# Patient Record
Sex: Male | Born: 1935 | Race: White | Hispanic: No | Smoking: Former smoker
Health system: Southern US, Community
[De-identification: ages and names within clinical notes are randomized; demographics above are authoritative.]

## PROBLEM LIST (undated history)

## (undated) DIAGNOSIS — F0390 Unspecified dementia without behavioral disturbance: Secondary | ICD-10-CM

## (undated) DIAGNOSIS — R42 Dizziness and giddiness: Secondary | ICD-10-CM

## (undated) DIAGNOSIS — I639 Cerebral infarction, unspecified: Secondary | ICD-10-CM

## (undated) DIAGNOSIS — I6529 Occlusion and stenosis of unspecified carotid artery: Secondary | ICD-10-CM

## (undated) DIAGNOSIS — I10 Essential (primary) hypertension: Secondary | ICD-10-CM

## (undated) DIAGNOSIS — K219 Gastro-esophageal reflux disease without esophagitis: Secondary | ICD-10-CM

## (undated) DIAGNOSIS — M1990 Unspecified osteoarthritis, unspecified site: Secondary | ICD-10-CM

## (undated) HISTORY — DX: Occlusion and stenosis of unspecified carotid artery: I65.29

## (undated) HISTORY — PX: CATARACT EXTRACTION: SUR2

---

## 2006-06-01 ENCOUNTER — Ambulatory Visit (HOSPITAL_COMMUNITY): Admission: RE | Admit: 2006-06-01 | Discharge: 2006-06-01 | Payer: Self-pay | Admitting: Ophthalmology

## 2006-09-21 ENCOUNTER — Ambulatory Visit (HOSPITAL_COMMUNITY): Admission: RE | Admit: 2006-09-21 | Discharge: 2006-09-21 | Payer: Self-pay | Admitting: Ophthalmology

## 2008-09-26 ENCOUNTER — Ambulatory Visit (HOSPITAL_COMMUNITY): Admission: RE | Admit: 2008-09-26 | Discharge: 2008-09-26 | Payer: Self-pay | Admitting: Pulmonary Disease

## 2011-10-05 ENCOUNTER — Emergency Department (HOSPITAL_COMMUNITY): Payer: Medicare Other

## 2011-10-05 ENCOUNTER — Inpatient Hospital Stay (HOSPITAL_COMMUNITY)
Admission: EM | Admit: 2011-10-05 | Discharge: 2011-10-07 | DRG: 066 | Disposition: A | Discharge status: Home / Self Care | Payer: Medicare Other | Attending: Pulmonary Disease | Admitting: Pulmonary Disease

## 2011-10-05 ENCOUNTER — Other Ambulatory Visit: Payer: Self-pay

## 2011-10-05 ENCOUNTER — Encounter: Payer: Self-pay | Admitting: Emergency Medicine

## 2011-10-05 DIAGNOSIS — I63239 Cerebral infarction due to unspecified occlusion or stenosis of unspecified carotid arteries: Principal | ICD-10-CM | POA: Diagnosis present

## 2011-10-05 DIAGNOSIS — R29898 Other symptoms and signs involving the musculoskeletal system: Secondary | ICD-10-CM | POA: Diagnosis present

## 2011-10-05 DIAGNOSIS — G459 Transient cerebral ischemic attack, unspecified: Secondary | ICD-10-CM | POA: Diagnosis present

## 2011-10-05 DIAGNOSIS — I639 Cerebral infarction, unspecified: Secondary | ICD-10-CM

## 2011-10-05 HISTORY — DX: Unspecified osteoarthritis, unspecified site: M19.90

## 2011-10-05 HISTORY — DX: Dizziness and giddiness: R42

## 2011-10-05 LAB — BASIC METABOLIC PANEL
CO2: 30 mEq/L (ref 19–32)
Chloride: 97 mEq/L (ref 96–112)
Creatinine, Ser: 1.19 mg/dL (ref 0.50–1.35)
GFR calc non Af Amer: 55 mL/min — ABNORMAL LOW (ref >90)
Potassium: 3.7 mEq/L (ref 3.5–5.1)
Sodium: 137 mEq/L (ref 135–145)

## 2011-10-05 LAB — CBC: RDW: 13.2 % (ref 11.5–15.5)

## 2011-10-05 LAB — CARDIAC PANEL(CRET KIN+CKTOT+MB+TROPI)
CK, MB: 2.9 ng/mL (ref 0.3–4.0)
Relative Index: INVALID (ref 0.0–2.5)
Troponin I: <0.3 ng/mL (ref <0.30)

## 2011-10-05 LAB — PROTIME-INR: INR: 1.07 (ref 0.00–1.49)

## 2011-10-05 MED ORDER — ENOXAPARIN SODIUM 40 MG/0.4ML ~~LOC~~ SOLN
40.0000 mg | SUBCUTANEOUS | Status: DC
  Administered 2011-10-05 – 2011-10-06 (×2): 40 mg via SUBCUTANEOUS
  Filled 2011-10-05 (×2): qty 0.4

## 2011-10-05 MED ORDER — ONDANSETRON HCL 4 MG/2ML IJ SOLN: 4.0000 mg | Freq: Four times a day (QID) | INTRAMUSCULAR | Status: DC | PRN

## 2011-10-05 MED ORDER — SODIUM CHLORIDE 0.9 % IJ SOLN: 3.0000 mL | INTRAMUSCULAR | Status: DC | PRN

## 2011-10-05 MED ORDER — CLOPIDOGREL BISULFATE 75 MG PO TABS
75.0000 mg | ORAL_TABLET | Freq: Every day | ORAL | Status: DC
  Administered 2011-10-06 – 2011-10-07 (×2): 75 mg via ORAL
  Filled 2011-10-05 (×2): qty 1

## 2011-10-05 MED ORDER — SODIUM CHLORIDE 0.9 % IV SOLN: 250.0000 mL | INTRAVENOUS | Status: DC

## 2011-10-05 MED ORDER — ALUM & MAG HYDROXIDE-SIMETH 200-200-20 MG/5ML PO SUSP: 30.0000 mL | Freq: Four times a day (QID) | ORAL | Status: DC | PRN

## 2011-10-05 MED ORDER — ROSUVASTATIN CALCIUM 20 MG PO TABS
40.0000 mg | ORAL_TABLET | Freq: Every day | ORAL | Status: DC
  Administered 2011-10-05 – 2011-10-06 (×2): 40 mg via ORAL
  Filled 2011-10-05: qty 1
  Filled 2011-10-05: qty 2

## 2011-10-05 MED ORDER — RAMIPRIL 2.5 MG PO CAPS
2.5000 mg | ORAL_CAPSULE | Freq: Every day | ORAL | Status: DC
  Administered 2011-10-05 – 2011-10-07 (×3): 2.5 mg via ORAL
  Filled 2011-10-05 (×3): qty 1

## 2011-10-05 MED ORDER — ACETAMINOPHEN 650 MG RE SUPP: 650.0000 mg | Freq: Four times a day (QID) | RECTAL | Status: DC | PRN

## 2011-10-05 MED ORDER — ONDANSETRON HCL 4 MG PO TABS: 4.0000 mg | ORAL_TABLET | Freq: Four times a day (QID) | ORAL | Status: DC | PRN

## 2011-10-05 MED ORDER — SENNA 8.6 MG PO TABS: 2.0000 | ORAL_TABLET | Freq: Every day | ORAL | Status: DC | PRN

## 2011-10-05 MED ORDER — SODIUM CHLORIDE 0.9 % IJ SOLN
3.0000 mL | Freq: Two times a day (BID) | INTRAMUSCULAR | Status: DC
  Administered 2011-10-05 – 2011-10-06 (×3): 3 mL via INTRAVENOUS
  Filled 2011-10-05: qty 3

## 2011-10-05 MED ORDER — ACETAMINOPHEN 325 MG PO TABS
650.0000 mg | ORAL_TABLET | Freq: Four times a day (QID) | ORAL | Status: DC | PRN
  Filled 2011-10-05: qty 2

## 2011-10-05 MED ORDER — SODIUM CHLORIDE 0.9 % IV SOLN
INTRAVENOUS | Status: DC
  Administered 2011-10-05: 12:00:00 via INTRAVENOUS

## 2011-10-05 MED ORDER — TRAZODONE HCL 50 MG PO TABS: 25.0000 mg | ORAL_TABLET | Freq: Every evening | ORAL | Status: DC | PRN

## 2011-10-05 NOTE — ED Notes (Signed)
Patient with c/o right sided weakness. Patient reports he went into church this morning around 0715am and states, "everything got heavy, it was difficult to do normal things, I forgot where things were, my right side felt heavy, and I had difficulty getting my words out." Son reports his father's speech is slightly slurred and that the right side of his face is slightly drooping. Patient alert/oriented and able to follow commands. No arm drift noted.

## 2011-10-05 NOTE — ED Provider Notes (Addendum)
History    Scribed for Shelda Jakes, MD, the patient was seen in room [facility]. This chart was scribed by Katha Cabal.   CSN: [id number] Arrival date & time: 10/05/2011 10:52 AM   First MD Initiated Contact with Patient 10/05/11 1048      Chief Complaint  Patient presents with  . Extremity Weakness    (Consider location/radiation/quality/duration/timing/severity/associated sxs/prior treatment) Patient is a 77 y.o. male presenting with extremity weakness. The history is provided by the patient and a relative. No language interpreter was used.  Extremity Weakness This is a new problem. The current episode started 3 to 5 hours ago. The problem occurs constantly. The problem has been gradually improving. Pertinent negatives include no headaches and no shortness of breath. He has tried nothing for the symptoms.  Patient drove to church at 7 AM.  Patient reports sudden onset of right leg heaviness.  Patient adds that he was not able to lift coffee pot at church.  Patient states "I felt like I was sloppy and heavy."  Patient did not notice any weakness before heading to church this AM.    Patient was able to drive home from church.  Patient states "it was hard to take clothes off" after returning home.  Family reports that patient was unsure how to change channel on TV and having trouble talking this AM.  Patient reports intermittent dizziness while bending over several months ago.    PCP Dr. Juanetta Gosling    Past Medical History  Diagnosis Date  . Arthritis   . Dizziness     Past Surgical History  Procedure Date  . Cataract extraction     No family history on file.  History  Substance Use Topics  . Smoking status: Former Games developer  . Smokeless tobacco: Not on file  . Alcohol Use: No      Review of Systems  Constitutional: Negative for fever.  Respiratory: Negative for shortness of breath.   Gastrointestinal: Negative for nausea, vomiting and diarrhea.  Genitourinary:  Negative for dysuria.  Musculoskeletal: Positive for extremity weakness. Negative for back pain.  Skin: Negative for rash.  Neurological: Negative for headaches.  Psychiatric/Behavioral: Positive for confusion.  All other systems reviewed and are negative.    Allergies  Review of patient's allergies indicates no known allergies.  Home Medications  No current outpatient prescriptions on file.  BP 192/87  Pulse 89  Temp(Src) 97.8 F (36.6 C) (Oral)  Resp 19  Ht 5\' 11"  (1.803 m)  Wt 165 lb (74.844 kg)  BMI 23.01 kg/m2  SpO2 97%  Physical Exam  Constitutional: He is oriented to person, place, and time. He appears well-developed and well-nourished. No distress.  HENT:  Head: Normocephalic and atraumatic.  Eyes: Conjunctivae and EOM are normal.  Neck: Neck supple.  Cardiovascular: Normal rate and regular rhythm.   Pulmonary/Chest: Effort normal and breath sounds normal. No respiratory distress.  Abdominal: Soft. Bowel sounds are normal. There is no tenderness. There is no rebound and no guarding.  Musculoskeletal: Normal range of motion. He exhibits no edema.  Neurological: He is alert and oriented to person, place, and time. No cranial nerve deficit. Coordination and gait normal.       equal strength in bilateral LE,  Subtle right hand weakness as compared to left,  per family subtle right side facial droop,   Skin: Skin is warm and dry.  Psychiatric: He has a normal mood and affect. His speech is normal and behavior is normal.  ED Course  Procedures (including critical care time)   DIAGNOSTIC STUDIES: Oxygen Saturation is 100% on room air, normal by my interpretation.     EKG:  Date: 10/05/2011  Rate: 87  Rhythm: normal sinus rhythm and sinus arrhythmia  QRS Axis: normal  Intervals: normal  ST/T Wave abnormalities: early repolarization  Conduction Disutrbances:none  Narrative Interpretation:   Old EKG Reviewed: none available    COORDINATION OF  CARE:  11:34 AM  Physical exam complete.  Advised patient and family of stroke protocol.  Will likely admit patient for observation.   12:07 PM  Head CT was negative.  12:47 PM Consult call returned.  Consulted with Dr. Juanetta Gosling, Internal medicine regarding patient's case.  Dr. Juanetta Gosling will evaluate patient in ED.  12:51 PM  Advised patient and family that Dr. Juanetta Gosling will be in to evaluate patient.      Orders Placed This Encounter  Procedures  . CT Head Wo Contrast  . CBC  . Basic metabolic panel  . Protime-INR  . APTT  . Cardiac panel(cret kin+cktot+mb+tropi)  . Diet NPO time specified  . Vital signs every 2 hours x 12 hours then every 4 hours  . Neuro checks every 2 hours x 12 hours then every 4 hours  . DO NOT attempt to lower BP  . CBG (Glucose < 50 or >400 contraindication for tPA)  . If O2 Sat <92%, administer O2 at 2 liters/minute via nasal cannula.  Gavin Potters attending physician after Code Stroke put in process.  . DO NOT INFUSE DEXTROSE SOLUTIONS  . Consult to internal medicine     LABS / RADIOLOGY:   Labs Reviewed  BASIC METABOLIC PANEL - Abnormal; Notable for the following:    Glucose, Bld 108 (*)    BUN 24 (*)    Calcium 11.2 (*)    GFR calc non Af Amer 55 (*)    GFR calc Af Amer 63 (*)    All other components within normal limits  CBC  PROTIME-INR  APTT  CARDIAC PANEL(CRET KIN+CKTOT+MB+TROPI)   Results for orders placed during the hospital encounter of 10/05/11  CBC      Component Value Range   WBC 6.9  4.0 - 10.5 (K/uL)   RBC 4.98  4.22 - 5.81 (MIL/uL)   Hemoglobin 14.4  13.0 - 17.0 (g/dL)   HCT 40.9  81.1 - 91.4 (%)   MCV 89.2  78.0 - 100.0 (fL)   MCH 28.9  26.0 - 34.0 (pg)   MCHC 32.4  30.0 - 36.0 (g/dL)   RDW 78.2  95.6 - 21.3 (%)   Platelets 269  150 - 400 (K/uL)  BASIC METABOLIC PANEL      Component Value Range   Sodium 137  135 - 145 (mEq/L)   Potassium 3.7  3.5 - 5.1 (mEq/L)   Chloride 97  96 - 112 (mEq/L)   CO2 30  19 - 32 (mEq/L)    Glucose, Bld 108 (*) 70 - 99 (mg/dL)   BUN 24 (*) 6 - 23 (mg/dL)   Creatinine, Ser 0.86  0.50 - 1.35 (mg/dL)   Calcium 57.8 (*) 8.4 - 10.5 (mg/dL)   GFR calc non Af Amer 55 (*) >90 (mL/min)   GFR calc Af Amer 63 (*) >90 (mL/min)  PROTIME-INR      Component Value Range   Prothrombin Time 14.1  11.6 - 15.2 (seconds)   INR 1.07  0.00 - 1.49   APTT      Component  Value Range   aPTT 31  24 - 37 (seconds)  CARDIAC PANEL(CRET KIN+CKTOT+MB+TROPI)      Component Value Range   Total CK 44  7 - 232 (U/L)   CK, MB 2.9  0.3 - 4.0 (ng/mL)   Troponin I <0.30  <0.30 (ng/mL)   Relative Index RELATIVE INDEX IS INVALID  0.0 - 2.5     Ct Head Wo Contrast  10/05/2011  *RADIOLOGY REPORT*  Clinical Data: Right leg/arm weakness, evaluate for stroke  CT HEAD WITHOUT CONTRAST  Technique:  Contiguous axial images were obtained from the base of the skull through the vertex without contrast.  Comparison: 09/27/2011  Findings: No evidence of parenchymal hemorrhage or extra-axial fluid collection. No mass lesion, mass effect, or midline shift.  No CT evidence of acute infarction.  Extensive subcortical white matter and periventricular small vessel ischemic changes, including the subcortical left frontal lobe, unchanged.  Intracranial atherosclerosis.  Global cortical atrophy.  The visualized paranasal sinuses are essentially clear. The mastoid air cells are unopacified.  No evidence of calvarial fracture.  IMPRESSION: No evidence of acute intracranial abnormality.  Atrophy with extensive small vessel ischemic changes and intracranial atherosclerosis.  Original Report Authenticated By: Charline Bills, M.D.         MDM   MDM: Patient clinically has had a mild stroke may still be a TIA enough time is not progressed to make determination. Head CT negative. Symptoms seem to be improving in the emergency partner still has some mild mild right hand weakness. Discussed with Dr. Juanetta Gosling who is his primary care provider  he will come in and admit the patient. MRI is not available today. Patient presented outside the TPA window however his symptoms were so mild upon presentation TPA probably would not been recommended.   CRITICAL CARE Performed by: Shelda Jakes.   Total critical care time: 30  Critical care time was exclusive of separately billable procedures and treating other patients.  Critical care was necessary to treat or prevent imminent or life-threatening deterioration.  Critical care was time spent personally by me on the following activities: development of treatment plan with patient and/or surrogate as well as nursing, discussions with consultants, evaluation of patient's response to treatment, examination of patient, obtaining history from patient or surrogate, ordering and performing treatments and interventions, ordering and review of laboratory studies, ordering and review of radiographic studies, pulse oximetry and re-evaluation of patient's condition.  Patient blood pressure slightly elevated today the systolic is only in the 190s diastolic was normal no reason to intervene on lowering the blood pressure at this level. In fact it could be determined to do so. Patient not a candidate for TPA as stated above. Patient was monitored carefully pending lab results.   MEDICATIONS GIVEN IN THE E.D. Scheduled Meds:  Continuous Infusions:    . sodium chloride 100 mL/hr at 10/05/11 1151       IMPRESSION: Stroke    I personally performed the services described in this documentation, which was scribed in my presence. The recorded information has been reviewed and considered.             Shelda Jakes, MD 10/05/11 1300  Shelda Jakes, MD 10/06/11 [phone]

## 2011-10-05 NOTE — H&P (Signed)
Clinton Chambers MRN: [id number] DOB/AGE: 1926-06-17 77 y.o. Primary Care Physician:No primary provider on file. Admit date: 10/05/2011 Chief Complaint: TIA versus stroke HPI: This is an 77 year old who was in his usual state of good health at home. He said he got up and had sudden onset of right leg and right arm heaviness and weakness. He was clumsy with his right hand he was not able to lift a coffee pot. He became somewhat confused. He eventually got home and his family came to check on him and thought that he needed to come to the emergency room. In the emergency room he has had almost complete recovery but still has some right-sided weakness. There is some question as to whether he had some problem with speech and he did seem a little confused.  Past Medical History  Diagnosis Date  . Arthritis   . Dizziness    Past Surgical History  Procedure Date  . Cataract extraction         No family history on file.  Social History:  reports that he has quit smoking. He does not have any smokeless tobacco history on file. He reports that he does not drink alcohol or use illicit drugs. He lives at home alone  Allergies:  Allergies  Allergen Reactions  . Vicodin (Hydrocodone-Acetaminophen) Other (See Comments)    'too strong' causes me to 'fall out'    Medications Prior to Admission  Medication Dose Route Frequency Provider Last Rate Last Dose  . 0.9 %  sodium chloride infusion   Intravenous Continuous Shelda Jakes, MD 100 mL/hr at 10/05/11 1151     No current outpatient prescriptions on file as of 10/05/2011.       WUJ:WJXBJ from the symptoms mentioned above,there are no other symptoms referable to all systems reviewed.  Physical Exam: Blood pressure 192/87, pulse 89, temperature 97.8 F (36.6 C), temperature source Oral, resp. rate 19, height 5\' 11"  (1.803 m), weight 74.844 kg (165 lb), SpO2 97.00%. He is awake and alert. He has some questionable asymmetry of his face.  His tongue protrudes midline. His pupils are reactive to light and accommodation. His nose and throat are clear. His neck is supple without masses bruits or JVD. His chest is clear without wheezes. His heart is regular without murmur. His abdomen is soft. Bowel sounds present and active. There are no masses. His extremities showed no edema. He is weak on the right side with some decrease in grip strength and some decrease in strength in his right leg. He is alert and oriented.    Basename 10/05/11 1144  WBC 6.9  NEUTROABS --  HGB 14.4  HCT 44.4  MCV 89.2  PLT 269    Basename 10/05/11 1144  NA 137  K 3.7  CL 97  CO2 30  GLUCOSE 108*  BUN 24*  CREATININE 1.19  CALCIUM 11.2*  MG --  lablast2(ast:2,ALT:2,alkphos:2,bilitot:2,prot:2,albumin:2)@ No results found for this basename: LIPASE:2,AMYLASE:2 in the last 72 hours No results found for this basename: AMMONIA:2 in the last 72 hourss:2)@  No results found for this or any previous visit (from the past 240 hour(s)).   Ct Head Wo Contrast  10/05/2011  *RADIOLOGY REPORT*  Clinical Data: Right leg/arm weakness, evaluate for stroke  CT HEAD WITHOUT CONTRAST  Technique:  Contiguous axial images were obtained from the base of the skull through the vertex without contrast.  Comparison: 09/27/2011  Findings: No evidence of parenchymal hemorrhage or extra-axial fluid collection. No mass lesion, mass effect,  or midline shift.  No CT evidence of acute infarction.  Extensive subcortical white matter and periventricular small vessel ischemic changes, including the subcortical left frontal lobe, unchanged.  Intracranial atherosclerosis.  Global cortical atrophy.  The visualized paranasal sinuses are essentially clear. The mastoid air cells are unopacified.  No evidence of calvarial fracture.  IMPRESSION: No evidence of acute intracranial abnormality.  Atrophy with extensive small vessel ischemic changes and intracranial atherosclerosis.  Original Report  Authenticated By: Charline Bills, M.D.   Impression: I think she's probably had a TIA versus a stroke he still has some residual symptoms what may be more of a stroke and TIA. By the time he came to the emergency room he was past the time interval in which urgent treatment can be done. His CT does not show a definite change. Active Problems:  * No active hospital problems. *      Plan: He will be admitted placed on Plavix, Atace, and a statin. He will have neurology consultation and an MRI of the brain      Faris Coolman L 10/05/2011, 1:58 PM

## 2011-10-06 ENCOUNTER — Observation Stay (HOSPITAL_COMMUNITY): Payer: Medicare Other

## 2011-10-06 LAB — BASIC METABOLIC PANEL
BUN: 24 mg/dL — ABNORMAL HIGH (ref 6–23)
Calcium: 9.7 mg/dL (ref 8.4–10.5)
GFR calc Af Amer: 73 mL/min — ABNORMAL LOW (ref >90)
GFR calc non Af Amer: 63 mL/min — ABNORMAL LOW (ref >90)
Glucose, Bld: 90 mg/dL (ref 70–99)
Potassium: 4 mEq/L (ref 3.5–5.1)
Sodium: 139 mEq/L (ref 135–145)

## 2011-10-06 LAB — GLUCOSE, CAPILLARY: Glucose-Capillary: 93 mg/dL (ref 70–99)

## 2011-10-06 LAB — CBC
Hemoglobin: 13.2 g/dL (ref 13.0–17.0)
MCH: 28.7 pg (ref 26.0–34.0)
MCHC: 32.4 g/dL (ref 30.0–36.0)
Platelets: 219 10*3/uL (ref 150–400)
RBC: 4.6 MIL/uL (ref 4.22–5.81)

## 2011-10-06 NOTE — Consult Note (Signed)
NAME:  Clinton Chambers, Clinton Chambers                ACCOUNT NO.:  [id number]  MEDICAL RECORD NO.:  [id number]  LOCATION:  A329                          FACILITY:  [facility]  PHYSICIAN:  Lynise Porr A. Gerilyn Pilgrim, M.D. DATE OF BIRTH:  09-27-1926  DATE OF CONSULTATION: DATE OF DISCHARGE:                                CONSULTATION   REASON FOR CONSULTATION:  Possible TIA.  This is an 77 year old white male who presents with the acute onset of right upper extremity heaviness and numbness.  At the same time, he also did have some symptoms involving the right leg, but to a much lesser degree.  There is also brief slurring of the speech.  He still has some residual right upper extremity heaviness and weakness.  He reports that this is not back to normal.  PAST MEDICAL HISTORY:  Arthritis, dizziness.  PAST SURGICAL HISTORY:  Cataract extraction.  FAMILY HISTORY:  Negative.  SOCIAL HISTORY:  Quit smoking.  No alcohol or tobacco use.  ALLERGIES:  VICODIN which causes him to fall out.  ADMISSION MEDICATION:  Vitamin C, aspirin, and vitamin D.  CURRENT MEDICATIONS:  Plavix, enoxaparin, ramipril, and rosuvastatin.  PHYSICAL EXAMINATION:  GENERAL:  A thin, pleasant man.  He is in no acute distress. VITAL SIGNS:  Temperature is 97.9, heart rate 71, and blood pressure 128/72. HEENT:  Head is normocephalic and atraumatic. NECK:  Supple. ABDOMEN:  Soft. EXTREMITIES:  No significant edema. MENTATION:  He is awake and alert.  I see no dysarthria at this point in time.  He speaks in full, clear sentences.  Comprehension and fluency is good.  Language and cognition are normal.  Cranial nerve evaluation, pupils are equal, round, and reactive to light.  Visual fields are intact.  Extraocular movements are full.  Facial muscle strength is symmetric.  Tongue midline.  Uvula midline.  Shoulder shrugs normal. Motor examination shows mild pronator drift involving the right upper extremity.  Fine finger movement is also mild to  modestly impaired on the right side.  Other extremities show normal tone, bulk, and strength. In fact, in the right upper extremity also has good strength on direct testing both proximally and distally.  Reflexes are preserved throughout.  Plantars are both downgoing.  Sensation normal to temperature and light touch.  Coordination shows no dysmetria.  Tremors are parkinsonism.  Head CT scan of brain shows extensive small vessel chronic ischemic changes and atrophy.  There is nothing acute, however.  WBC 5, hemoglobin 13, platelet count of 219.  Sodium 139, potassium 4, chloride 103, CO2 of 26, glucose 90, BUN 24, creatinine 1.06.  CPK 44.  IMPRESSION:  Likely small subcortical infarct involving the left hemisphere, risk factors age.  The patient does have an MRI pending.  He has been switched over to Plavix which is appropriate.  A Carotid Doppler will also be obtained.  Thanks for this consultation.     Kanoelani Dobies A. Gerilyn Pilgrim, M.D.     KAD/MEDQ  D:  10/06/2011  T:  10/06/2011  Job:  [id number]

## 2011-10-06 NOTE — Progress Notes (Signed)
Physical Therapy Evaluation Patient Details Name: TRAYCE MAINO MRN: [id number] DOB: 27-Apr-1926 Today's Date: 10/06/2011  Problem List:  Patient Active Problem List  Diagnoses  . TIA (transient ischemic attack)    Past Medical History:  Past Medical History  Diagnosis Date  . Arthritis   . Dizziness    Past Surgical History:  Past Surgical History  Procedure Date  . Cataract extraction     PT Assessment/Plan/Recommendation PT Assessment Clinical Impression Statement: no significant functional problems noted...pt describes R hand as being slightly weaker than L, but is [facility] [place]...gait is stable on all levels PT Recommendation/Assessment: Patent does not need any further PT services No Skilled PT: Patient at baseline level of functioning;All education completed PT Goals     PT Evaluation Precautions/Restrictions  Precautions Required Braces or Orthoses: No Restrictions Weight Bearing Restrictions: No Prior Functioning  Home Living Lives With: Son Type of Home: House Home Layout: One level Home Access: Stairs to enter Entrance Stairs-Rails: Right Entrance Stairs-Number of Steps: 2 Home Adaptive Equipment: None Prior Function Level of Independence: Independent with basic ADLs;Independent with homemaking with ambulation;Independent with gait;Independent with transfers Driving: Yes Vocation: Retired Producer, television/film/video: Awake/alert Overall Cognitive Status: Appears within functional limits for tasks assessed Orientation Level: Oriented X4 Sensation/Coordination Sensation Light Touch: Appears Intact Stereognosis: Not tested Hot/Cold: Not tested Proprioception: Appears Intact Coordination Gross Motor Movements are Fluid and Coordinated: Yes Fine Motor Movements are Fluid and Coordinated: Yes Finger Nose Finger Test: WNL Heel Shin Test: WNL Extremity Assessment RUE Assessment RUE Assessment: Within Functional Limits (very mild weakness compared  to LUE) LUE Assessment LUE Assessment: Within Functional Limits RLE Assessment RLE Assessment: Within Functional Limits LLE Assessment LLE Assessment: Within Functional Limits Mobility (including Balance) Bed Mobility Bed Mobility: Yes Supine to Sit: 7: Independent Transfers Transfers: Yes Sit to Stand: 7: Independent Stand to Sit: 7: Independent Ambulation/Gait Ambulation/Gait: Yes Ambulation/Gait Assistance: 7: Independent Ambulation Distance (Feet): 250 Feet Assistive device: None Gait Pattern: Within Functional Limits Stairs: Yes Stairs Assistance: 7: Independent Stair Management Technique: One rail Right;Alternating pattern Number of Stairs: 12  Wheelchair Mobility Wheelchair Mobility: No  Posture/Postural Control Posture/Postural Control: No significant limitations Balance Balance Assessed:  (WNL) Exercise    End of Session PT - End of Session Equipment Utilized During Treatment: Gait belt Activity Tolerance: Patient tolerated treatment well Patient left: in bed General Behavior During Session: [facility] for tasks performed Cognition: [facility] for tasks performed  Konrad Penta 10/06/2011, 2:15 PM

## 2011-10-06 NOTE — Consult Note (Signed)
Reason for Consult: Referring Physician:   [name] is an 76 y.o. male.  HPI:   Past Medical History  Diagnosis Date  . Arthritis   . Dizziness     Past Surgical History  Procedure Date  . Cataract extraction     History reviewed. No pertinent family history.  Social History:  reports that he has quit smoking. He does not have any smokeless tobacco history on file. He reports that he does not drink alcohol or use illicit drugs.  Allergies:  Allergies  Allergen Reactions  . Vicodin (Hydrocodone-Acetaminophen) Other (See Comments)    'too strong' causes me to 'fall out'    Medications:  Prior to Admission medications   Medication Sig Start Date End Date Taking? Authorizing Provider  Ascorbic Acid (VITAMIN C) 100 MG tablet Take 100 mg by mouth daily.     Yes Historical Provider, MD  aspirin EC 81 MG tablet Take 81 mg by mouth daily.     Yes Historical Provider, MD  Cholecalciferol (VITAMIN D PO) Take 1 tablet by mouth daily. Unsure of strength   Yes Historical Provider, MD    Scheduled Meds:   . clopidogrel  75 mg Oral Q breakfast  . enoxaparin  40 mg Subcutaneous Q24H  . ramipril  2.5 mg Oral Daily  . rosuvastatin  40 mg Oral q1800  . sodium chloride  3 mL Intravenous Q12H   Continuous Infusions:   . sodium chloride 100 mL/hr at 10/05/11 1151  . sodium chloride     PRN Meds:.acetaminophen, acetaminophen, alum & mag hydroxide-simeth, ondansetron (ZOFRAN) IV, ondansetron, senna, sodium chloride, traZODone   Results for orders placed during the hospital encounter of 10/05/11 (from the past 48 hour(s))  CBC     Status: Normal   Collection Time   10/05/11 11:44 AM      Component Value Range Comment   WBC 6.9  4.0 - 10.5 (K/uL)    RBC 4.98  4.22 - 5.81 (MIL/uL)    Hemoglobin 14.4  13.0 - 17.0 (g/dL)    HCT 30.8  65.7 - 84.6 (%)    MCV 89.2  78.0 - 100.0 (fL)    MCH 28.9  26.0 - 34.0 (pg)    MCHC 32.4  30.0 - 36.0 (g/dL)    RDW 96.2  95.2 - 84.1 (%)    Platelets 269  150 - 400 (K/uL)   BASIC METABOLIC PANEL     Status: Abnormal   Collection Time   10/05/11 11:44 AM      Component Value Range Comment   Sodium 137  135 - 145 (mEq/L)    Potassium 3.7  3.5 - 5.1 (mEq/L)    Chloride 97  96 - 112 (mEq/L)    CO2 30  19 - 32 (mEq/L)    Glucose, Bld 108 (*) 70 - 99 (mg/dL)    BUN 24 (*) 6 - 23 (mg/dL)    Creatinine, Ser 3.24  0.50 - 1.35 (mg/dL)    Calcium 40.1 (*) 8.4 - 10.5 (mg/dL)    GFR calc non Af Amer 55 (*) >90 (mL/min)    GFR calc Af Amer 63 (*) >90 (mL/min)   PROTIME-INR     Status: Normal   Collection Time   10/05/11 11:44 AM      Component Value Range Comment   Prothrombin Time 14.1  11.6 - 15.2 (seconds)    INR 1.07  0.00 - 1.49    APTT     Status: Normal  Collection Time   10/05/11 11:44 AM      Component Value Range Comment   aPTT 31  24 - 37 (seconds)   CARDIAC PANEL(CRET KIN+CKTOT+MB+TROPI)     Status: Normal   Collection Time   10/05/11 11:44 AM      Component Value Range Comment   Total CK 44  7 - 232 (U/L)    CK, MB 2.9  0.3 - 4.0 (ng/mL)    Troponin I <0.30  <0.30 (ng/mL)    Relative Index RELATIVE INDEX IS INVALID  0.0 - 2.5    BASIC METABOLIC PANEL     Status: Abnormal   Collection Time   10/06/11  5:43 AM      Component Value Range Comment   Sodium 139  135 - 145 (mEq/L)    Potassium 4.0  3.5 - 5.1 (mEq/L)    Chloride 103  96 - 112 (mEq/L)    CO2 26  19 - 32 (mEq/L)    Glucose, Bld 90  70 - 99 (mg/dL)    BUN 24 (*) 6 - 23 (mg/dL)    Creatinine, Ser 9.56  0.50 - 1.35 (mg/dL)    Calcium 9.7  8.4 - 10.5 (mg/dL)    GFR calc non Af Amer 63 (*) >90 (mL/min)    GFR calc Af Amer 73 (*) >90 (mL/min)   CBC     Status: Normal   Collection Time   10/06/11  5:43 AM      Component Value Range Comment   WBC 5.0  4.0 - 10.5 (K/uL)    RBC 4.60  4.22 - 5.81 (MIL/uL)    Hemoglobin 13.2  13.0 - 17.0 (g/dL)    HCT 21.3  08.6 - 57.8 (%)    MCV 88.5  78.0 - 100.0 (fL)    MCH 28.7  26.0 - 34.0 (pg)    MCHC 32.4   30.0 - 36.0 (g/dL)    RDW 46.9  62.9 - 52.8 (%)    Platelets 219  150 - 400 (K/uL)     Ct Head Wo Contrast  10/05/2011  *RADIOLOGY REPORT*  Clinical Data: Right leg/arm weakness, evaluate for stroke  CT HEAD WITHOUT CONTRAST  Technique:  Contiguous axial images were obtained from the base of the skull through the vertex without contrast.  Comparison: 09/27/2011  Findings: No evidence of parenchymal hemorrhage or extra-axial fluid collection. No mass lesion, mass effect, or midline shift.  No CT evidence of acute infarction.  Extensive subcortical white matter and periventricular small vessel ischemic changes, including the subcortical left frontal lobe, unchanged.  Intracranial atherosclerosis.  Global cortical atrophy.  The visualized paranasal sinuses are essentially clear. The mastoid air cells are unopacified.  No evidence of calvarial fracture.  IMPRESSION: No evidence of acute intracranial abnormality.  Atrophy with extensive small vessel ischemic changes and intracranial atherosclerosis.  Original Report Authenticated By: Charline Bills, M.D.    ROS Blood pressure 128/72, pulse 71, temperature 97.9 F (36.6 C), temperature source Oral, resp. rate 20, height 5\' 11"  (1.803 m), weight 65.5 kg (144 lb 6.4 oz), SpO2 91.00%. Physical Exam  Assessment/Plan: See dictation  Clinton Chambers 10/06/2011, 8:03 AM

## 2011-10-06 NOTE — Progress Notes (Signed)
UR Chart Review Completed  

## 2011-10-06 NOTE — Progress Notes (Signed)
Subjective: He says he feels better. He still has some weakness in his right arm but it has improved. Dr. Gerilyn Pilgrim is beginning his assessment. He is scheduled for MRI of the brain and carotid study today  Objective: Vital signs in last 24 hours: Temp:  [97.4 F (36.3 C)-97.9 F (36.6 C)] 97.9 F (36.6 C) (11/19 1610) Pulse Rate:  [60-94] 71  (11/19 0614) Resp:  [18-20] 20  (11/19 0614) BP: (128-192)/(72-95) 128/72 mmHg (11/19 0614) SpO2:  [89 %-100 %] 91 % (11/19 0614) Weight:  [65.5 kg (144 lb 6.4 oz)-74.844 kg (165 lb)] 144 lb 6.4 oz (65.5 kg) (11/18 1552) Weight change:  Last BM Date: 10/04/11  Intake/Output from previous day: 11/18 0701 - 11/19 0700 In: 100 [P.O.:100] Out: -   PHYSICAL EXAM General appearance: alert, cooperative and no distress Resp: clear to auscultation bilaterally Cardio: regular rate and rhythm, S1, S2 normal, no murmur, click, rub or gallop GI: soft, non-tender; bowel sounds normal; no masses,  no organomegaly Extremities: extremities normal, atraumatic, no cyanosis or edema  Lab Results:    Basic Metabolic Panel:  Basename 10/06/11 0543 10/05/11 1144  NA 139 137  K 4.0 3.7  CL 103 97  CO2 26 30  GLUCOSE 90 108*  BUN 24* 24*  CREATININE 1.06 1.19  CALCIUM 9.7 11.2*  MG -- --  PHOS -- --   Liver Function Tests: No results found for this basename: AST:2,ALT:2,ALKPHOS:2,BILITOT:2,PROT:2,ALBUMIN:2 in the last 72 hours No results found for this basename: LIPASE:2,AMYLASE:2 in the last 72 hours No results found for this basename: AMMONIA:2 in the last 72 hours CBC:  Basename 10/06/11 0543 10/05/11 1144  WBC 5.0 6.9  NEUTROABS -- --  HGB 13.2 14.4  HCT 40.7 44.4  MCV 88.5 89.2  PLT 219 269   Cardiac Enzymes:  Basename 10/05/11 1144  CKTOTAL 44  CKMB 2.9  CKMBINDEX --  TROPONINI <0.30   BNP: No results found for this basename: POCBNP:3 in the last 72 hours D-Dimer: No results found for this basename: DDIMER:2 in the last 72  hours CBG: No results found for this basename: GLUCAP:6 in the last 72 hours Hemoglobin A1C: No results found for this basename: HGBA1C in the last 72 hours Fasting Lipid Panel: No results found for this basename: CHOL,HDL,LDLCALC,TRIG,CHOLHDL,LDLDIRECT in the last 72 hours Thyroid Function Tests: No results found for this basename: TSH,T4TOTAL,FREET4,T3FREE,THYROIDAB in the last 72 hours Anemia Panel: No results found for this basename: VITAMINB12,FOLATE,FERRITIN,TIBC,IRON,RETICCTPCT in the last 72 hours Coagulation:  Basename 10/05/11 1144  LABPROT 14.1  INR 1.07   Urine Drug Screen:  Alcohol Level: No results found for this basename: ETH:2 in the last 72 hours Urinalysis:  Misc. Labs:  ABGS No results found for this basename: PHART,PCO2,PO2ART,TCO2,HCO3 in the last 72 hours CULTURES No results found for this or any previous visit (from the past 240 hour(s)). Studies/Results: Ct Head Wo Contrast  10/05/2011  *RADIOLOGY REPORT*  Clinical Data: Right leg/arm weakness, evaluate for stroke  CT HEAD WITHOUT CONTRAST  Technique:  Contiguous axial images were obtained from the base of the skull through the vertex without contrast.  Comparison: 09/27/2011  Findings: No evidence of parenchymal hemorrhage or extra-axial fluid collection. No mass lesion, mass effect, or midline shift.  No CT evidence of acute infarction.  Extensive subcortical white matter and periventricular small vessel ischemic changes, including the subcortical left frontal lobe, unchanged.  Intracranial atherosclerosis.  Global cortical atrophy.  The visualized paranasal sinuses are essentially clear. The mastoid air cells  are unopacified.  No evidence of calvarial fracture.  IMPRESSION: No evidence of acute intracranial abnormality.  Atrophy with extensive small vessel ischemic changes and intracranial atherosclerosis.  Original Report Authenticated By: Charline Bills, M.D.    Medications:  Prior to Admission:    Prescriptions prior to admission  Medication Sig Dispense Refill  . Ascorbic Acid (VITAMIN C) 100 MG tablet Take 100 mg by mouth daily.        Marland Kitchen aspirin EC 81 MG tablet Take 81 mg by mouth daily.        . Cholecalciferol (VITAMIN D PO) Take 1 tablet by mouth daily. Unsure of strength       Scheduled:   . clopidogrel  75 mg Oral Q breakfast  . enoxaparin  40 mg Subcutaneous Q24H  . ramipril  2.5 mg Oral Daily  . rosuvastatin  40 mg Oral q1800  . sodium chloride  3 mL Intravenous Q12H   Continuous:   . sodium chloride 100 mL/hr at 10/05/11 1151  . sodium chloride     RUE:[id number], acetaminophen, alum & mag hydroxide-simeth, ondansetron (ZOFRAN) IV, ondansetron, senna, sodium chloride, traZODone  Assesment: He has had a stroke. He is improved. Neurological workup is underway Principal Problem:  *TIA (transient ischemic attack)    Plan: No change in treatments he will have evaluation with MRI and carotid study and I will asked physical therapy to see him    LOS: 1 day   Tawan Corkern L 10/06/2011, 8:39 AM

## 2011-10-07 MED ORDER — CLOPIDOGREL BISULFATE 75 MG PO TABS: 75.0000 mg | ORAL_TABLET | Freq: Every day | ORAL | Status: DC

## 2011-10-07 MED ORDER — RAMIPRIL 2.5 MG PO CAPS: 2.5000 mg | ORAL_CAPSULE | Freq: Every day | ORAL | Status: DC

## 2011-10-07 MED ORDER — ROSUVASTATIN CALCIUM 40 MG PO TABS: 40.0000 mg | ORAL_TABLET | Freq: Every day | ORAL | Status: DC

## 2011-10-07 NOTE — Discharge Summary (Signed)
Physician Discharge Summary  Patient ID: Clinton Chambers MRN: [id number] DOB/AGE: Apr 01, 1926 77 y.o. Primary Care Physician:No primary provider on file. Admit date: 10/05/2011 Discharge date: 10/07/2011    Discharge Diagnoses:  Stroke Principal Problem:  *TIA (transient ischemic attack)   Current Discharge Medication List    START taking these medications   Details  clopidogrel (PLAVIX) 75 MG tablet Take 1 tablet (75 mg total) by mouth daily with breakfast. Qty: 30 tablet, Refills: 12    ramipril (ALTACE) 2.5 MG capsule Take 1 capsule (2.5 mg total) by mouth daily. Qty: 30 capsule, Refills: 12    rosuvastatin (CRESTOR) 40 MG tablet Take 1 tablet (40 mg total) by mouth daily at 6 PM. Qty: 40 tablet, Refills: 12      CONTINUE these medications which have NOT CHANGED   Details  Ascorbic Acid (VITAMIN C) 100 MG tablet Take 100 mg by mouth daily.      aspirin EC 81 MG tablet Take 81 mg by mouth daily.      Cholecalciferol (VITAMIN D PO) Take 1 tablet by mouth daily. Unsure of strength        Discharged Condition: Improved    Consults: Neurology  Significant Diagnostic Studies: Ct Head Wo Contrast  10/05/2011  *RADIOLOGY REPORT*  Clinical Data: Right leg/arm weakness, evaluate for stroke  CT HEAD WITHOUT CONTRAST  Technique:  Contiguous axial images were obtained from the base of the skull through the vertex without contrast.  Comparison: 09/27/2011  Findings: No evidence of parenchymal hemorrhage or extra-axial fluid collection. No mass lesion, mass effect, or midline shift.  No CT evidence of acute infarction.  Extensive subcortical white matter and periventricular small vessel ischemic changes, including the subcortical left frontal lobe, unchanged.  Intracranial atherosclerosis.  Global cortical atrophy.  The visualized paranasal sinuses are essentially clear. The mastoid air cells are unopacified.  No evidence of calvarial fracture.  IMPRESSION: No evidence of acute  intracranial abnormality.  Atrophy with extensive small vessel ischemic changes and intracranial atherosclerosis.  Original Report Authenticated By: Charline Bills, M.D.   Mr Brain Wo Contrast  10/06/2011  *RADIOLOGY REPORT*  Clinical Data: Right-sided weakness.  MRI HEAD WITHOUT CONTRAST  Technique:  Multiplanar, multiecho pulse sequences of the brain and surrounding structures were obtained according to standard protocol without intravenous contrast.  Comparison: 10/05/2011 CT.  No comparison MR.  Major intracranial vascular structures are patent.  Paranasal sinus mucosal thickening most notable right maxillary sinus.  Transverse ligament hypertrophy.  Findings: Motion degraded exam.  Scattered acute non hemorrhagic infarcts throughout the left hemisphere involving portions of the left basal ganglia, left frontal lobe, left parietal lobe and left temporal lobe.  Remote small cerebellar infarcts bilaterally.  Remote tiny right thalamic infarct.  Prominent small vessel disease type changes.  Global atrophy without hydrocephalus.  No intracranial hemorrhage.  No intracranial mass lesion detected on this unenhanced exam.  Major intracranial vascular structures appear to be patent.  IMPRESSION: Scattered acute non hemorrhagic infarcts throughout the left hemisphere involving portions of the left basal ganglia, left frontal lobe, left parietal lobe and left temporal lobe.  Please see above.  Original Report Authenticated By: Fuller Canada, M.D.   US Carotid Duplex Bilateral  10/06/2011  *RADIOLOGY REPORT*  Clinical Data: Left-sided scattered acute non hemorrhagic infarction.  BILATERAL CAROTID DUPLEX ULTRASOUND  Technique: Wallace Cullens scale imaging, color Doppler and duplex ultrasound was performed of bilateral carotid and vertebral arteries in the neck.  Comparison:  10/06/2011 MRI  Criteria:  Quantification  of carotid stenosis is based on velocity parameters that correlate the residual internal carotid diameter  with NASCET-based stenosis levels, using the diameter of the distal internal carotid lumen as the denominator for stenosis measurement.  The following velocity measurements were obtained:                   PEAK SYSTOLIC/END DIASTOLIC RIGHT ICA:                        78/10cm/sec CCA:                        91/5cm/sec SYSTOLIC ICA/CCA RATIO:     0.86 DIASTOLIC ICA/CCA RATIO:    2.25 ECA:                        99cm/sec  LEFT ICA:                        231/25cm/sec CCA:                        132/11cm/sec SYSTOLIC ICA/CCA RATIO:     1.74 DIASTOLIC ICA/CCA RATIO:    2.27 ECA:                        101cm/sec  Findings:  RIGHT CAROTID ARTERY: Mild scattered right carotid bifurcation atherosclerosis.  No hemodynamically significant right ICA stenosis, velocity elevation, or turbulent flow.  RIGHT VERTEBRAL ARTERY:  Antegrade  LEFT CAROTID ARTERY: Irregular complex hypoechoic rounded plaque formation in the left carotid bulb extending into the proximal ICA. In this region, there is proximal ICA focal aliasing and velocity elevation with slight turbulent flow.  Left proximal ICA velocity measures 231/25.  By ultrasound criteria, the left ICA stenosis is estimated at 50-70% percent.   The left carotid complex plaque formation could be a possible source of emboli.  LEFT VERTEBRAL ARTERY:  Antegrade  IMPRESSION: Heterogeneous complex left carotid bifurcation/proximal ICA nodular plaque formation, could be a source of emboli.  Associated proximal left ICA stenosis estimated at 50-70%.  Right ICA narrowing less than 50%.  Patent antegrade vertebral flow bilaterally.  Original Report Authenticated By: Judie Petit. Ruel Favors, M.D.    Lab Results: Basic Metabolic Panel:  Basename 10/06/11 0543 10/05/11 1144  NA 139 137  K 4.0 3.7  CL 103 97  CO2 26 30  GLUCOSE 90 108*  BUN 24* 24*  CREATININE 1.06 1.19  CALCIUM 9.7 11.2*  MG -- --  PHOS -- --   Liver Function Tests: No results found for this basename:  AST:2,ALT:2,ALKPHOS:2,BILITOT:2,PROT:2,ALBUMIN:2 in the last 72 hours No results found for this basename: LIPASE:2,AMYLASE:2 in the last 72 hours No results found for this basename: AMMONIA:2 in the last 72 hours CBC:  Basename 10/06/11 0543 10/05/11 1144  WBC 5.0 6.9  NEUTROABS -- --  HGB 13.2 14.4  HCT 40.7 44.4  MCV 88.5 89.2  PLT 219 269    No results found for this or any previous visit (from the past 240 hour(s)).   Hospital Course: He was admitted because of a stroke. He developed acute onset of right-sided weakness. This occurred early in the morning and he arrived in the emergency room several hours later. This was too late for any acute intervention. He was noted to have right-sided weakness which improved to some extent spontaneously.  He was evaluated with neurology consultation he was started on Plavix ramipril and Crestor. He improved. He underwent carotid study which showed 50-70% occlusion an area of plaque it may be a source of emboli. He had MRI that showed several areas of stroke there were small.  Discharge Exam: Blood pressure 154/88, pulse 74, temperature 98.6 F (37 C), temperature source Oral, resp. rate 24, height 5\' 11"  (1.803 m), weight 65.5 kg (144 lb 6.4 oz), SpO2 92.00%. History of his right hand and right arm and right leg had improved to about 4/5.  Disposition: Home. The note below says to followup with me in 2 days but I can't correct that on the system it he actually needs to followup in about 2 weeks. He will followup with the vascular surgeon to see if he needs surgery.    Follow-up Information    Follow up with Desire Fulp L in 2 days. (call for appointment time.)    Contact information:   [address] [place] [address] [phone]          Signed: Fredirick Maudlin 10/07/2011, 9:03 AM

## 2011-10-07 NOTE — Progress Notes (Signed)
Subjective: Interval History:   Objective: Vital signs in last 24 hours: Temp:  [97.4 F (36.3 C)-98.6 F (37 C)] 98.6 F (37 C) (11/20 0521) Pulse Rate:  [53-77] 74  (11/20 0521) Resp:  [20-24] 24  (11/20 0521) BP: (147-170)/(77-95) 154/88 mmHg (11/20 0521) SpO2:  [92 %-93 %] 92 % (11/20 0521)  Intake/Output from previous day: 11/19 0701 - 11/20 0700 In: 720 [P.O.:720] Out: -  Intake/Output this shift:   Nutritional status: General    Lab Results:  Basename 10/06/11 0543 10/05/11 1144  WBC 5.0 6.9  HGB 13.2 14.4  HCT 40.7 44.4  PLT 219 269  NA 139 137  K 4.0 3.7  CL 103 97  CO2 26 30  GLUCOSE 90 108*  BUN 24* 24*  CREATININE 1.06 1.19  CALCIUM 9.7 11.2*  LABA1C -- --   Lipid Panel No results found for this basename: CHOL,TRIG,HDL,CHOLHDL,VLDL,LDLCALC in the last 72 hours  Studies/Results: Ct Head Wo Contrast  10/05/2011  *RADIOLOGY REPORT*  Clinical Data: Right leg/arm weakness, evaluate for stroke  CT HEAD WITHOUT CONTRAST  Technique:  Contiguous axial images were obtained from the base of the skull through the vertex without contrast.  Comparison: 09/27/2011  Findings: No evidence of parenchymal hemorrhage or extra-axial fluid collection. No mass lesion, mass effect, or midline shift.  No CT evidence of acute infarction.  Extensive subcortical white matter and periventricular small vessel ischemic changes, including the subcortical left frontal lobe, unchanged.  Intracranial atherosclerosis.  Global cortical atrophy.  The visualized paranasal sinuses are essentially clear. The mastoid air cells are unopacified.  No evidence of calvarial fracture.  IMPRESSION: No evidence of acute intracranial abnormality.  Atrophy with extensive small vessel ischemic changes and intracranial atherosclerosis.  Original Report Authenticated By: Charline Bills, M.D.   Mr Brain Wo Contrast  10/06/2011  *RADIOLOGY REPORT*  Clinical Data: Right-sided weakness.  MRI HEAD WITHOUT  CONTRAST  Technique:  Multiplanar, multiecho pulse sequences of the brain and surrounding structures were obtained according to standard protocol without intravenous contrast.  Comparison: 10/05/2011 CT.  No comparison MR.  Major intracranial vascular structures are patent.  Paranasal sinus mucosal thickening most notable right maxillary sinus.  Transverse ligament hypertrophy.  Findings: Motion degraded exam.  Scattered acute non hemorrhagic infarcts throughout the left hemisphere involving portions of the left basal ganglia, left frontal lobe, left parietal lobe and left temporal lobe.  Remote small cerebellar infarcts bilaterally.  Remote tiny right thalamic infarct.  Prominent small vessel disease type changes.  Global atrophy without hydrocephalus.  No intracranial hemorrhage.  No intracranial mass lesion detected on this unenhanced exam.  Major intracranial vascular structures appear to be patent.  IMPRESSION: Scattered acute non hemorrhagic infarcts throughout the left hemisphere involving portions of the left basal ganglia, left frontal lobe, left parietal lobe and left temporal lobe.  Please see above.  Original Report Authenticated By: Fuller Canada, M.D.   US Carotid Duplex Bilateral  10/06/2011  *RADIOLOGY REPORT*  Clinical Data: Left-sided scattered acute non hemorrhagic infarction.  BILATERAL CAROTID DUPLEX ULTRASOUND  Technique: Wallace Cullens scale imaging, color Doppler and duplex ultrasound was performed of bilateral carotid and vertebral arteries in the neck.  Comparison:  10/06/2011 MRI  Criteria:  Quantification of carotid stenosis is based on velocity parameters that correlate the residual internal carotid diameter with NASCET-based stenosis levels, using the diameter of the distal internal carotid lumen as the denominator for stenosis measurement.  The following velocity measurements were obtained:  PEAK SYSTOLIC/END DIASTOLIC RIGHT ICA:                        78/10cm/sec CCA:                         91/5cm/sec SYSTOLIC ICA/CCA RATIO:     0.86 DIASTOLIC ICA/CCA RATIO:    2.25 ECA:                        99cm/sec  LEFT ICA:                        231/25cm/sec CCA:                        132/11cm/sec SYSTOLIC ICA/CCA RATIO:     1.74 DIASTOLIC ICA/CCA RATIO:    2.27 ECA:                        101cm/sec  Findings:  RIGHT CAROTID ARTERY: Mild scattered right carotid bifurcation atherosclerosis.  No hemodynamically significant right ICA stenosis, velocity elevation, or turbulent flow.  RIGHT VERTEBRAL ARTERY:  Antegrade  LEFT CAROTID ARTERY: Irregular complex hypoechoic rounded plaque formation in the left carotid bulb extending into the proximal ICA. In this region, there is proximal ICA focal aliasing and velocity elevation with slight turbulent flow.  Left proximal ICA velocity measures 231/25.  By ultrasound criteria, the left ICA stenosis is estimated at 50-70% percent.   The left carotid complex plaque formation could be a possible source of emboli.  LEFT VERTEBRAL ARTERY:  Antegrade  IMPRESSION: Heterogeneous complex left carotid bifurcation/proximal ICA nodular plaque formation, could be a source of emboli.  Associated proximal left ICA stenosis estimated at 50-70%.  Right ICA narrowing less than 50%.  Patent antegrade vertebral flow bilaterally.  Original Report Authenticated By: Judie Petit. Ruel Favors, M.D.    Medications:  Scheduled Meds:   . clopidogrel  75 mg Oral Q breakfast  . enoxaparin  40 mg Subcutaneous Q24H  . ramipril  2.5 mg Oral Daily  . rosuvastatin  40 mg Oral q1800  . sodium chloride  3 mL Intravenous Q12H   Continuous Infusions:   . sodium chloride 100 mL/hr at 10/05/11 1151  . sodium chloride     PRN Meds:.acetaminophen, acetaminophen, alum & mag hydroxide-simeth, ondansetron (ZOFRAN) IV, ondansetron, senna, sodium chloride, traZODone   Assessment/Plan: See dictation   LOS: 2 days   Erianna Jolly

## 2011-10-07 NOTE — Discharge Summary (Signed)
Physician Discharge Summary  Patient ID: Clinton Chambers MRN: [id number] DOB/AGE: 1926-06-02 77 y.o. Primary Care Physician:No primary provider on file. Admit date: 10/05/2011 Discharge date: 10/07/2011    Discharge Diagnoses:  Stroke Principal Problem:  *TIA (transient ischemic attack)   Current Discharge Medication List    START taking these medications   Details  clopidogrel (PLAVIX) 75 MG tablet Take 1 tablet (75 mg total) by mouth daily with breakfast. Qty: 30 tablet, Refills: 12    ramipril (ALTACE) 2.5 MG capsule Take 1 capsule (2.5 mg total) by mouth daily. Qty: 30 capsule, Refills: 12    rosuvastatin (CRESTOR) 40 MG tablet Take 1 tablet (40 mg total) by mouth daily at 6 PM. Qty: 40 tablet, Refills: 12      CONTINUE these medications which have NOT CHANGED   Details  Ascorbic Acid (VITAMIN C) 100 MG tablet Take 100 mg by mouth daily.      aspirin EC 81 MG tablet Take 81 mg by mouth daily.      Cholecalciferol (VITAMIN D PO) Take 1 tablet by mouth daily. Unsure of strength        Discharged Condition: Improved    Consults: Neurology  Significant Diagnostic Studies: Ct Head Wo Contrast  10/05/2011  *RADIOLOGY REPORT*  Clinical Data: Right leg/arm weakness, evaluate for stroke  CT HEAD WITHOUT CONTRAST  Technique:  Contiguous axial images were obtained from the base of the skull through the vertex without contrast.  Comparison: 09/27/2011  Findings: No evidence of parenchymal hemorrhage or extra-axial fluid collection. No mass lesion, mass effect, or midline shift.  No CT evidence of acute infarction.  Extensive subcortical white matter and periventricular small vessel ischemic changes, including the subcortical left frontal lobe, unchanged.  Intracranial atherosclerosis.  Global cortical atrophy.  The visualized paranasal sinuses are essentially clear. The mastoid air cells are unopacified.  No evidence of calvarial fracture.  IMPRESSION: No evidence of acute  intracranial abnormality.  Atrophy with extensive small vessel ischemic changes and intracranial atherosclerosis.  Original Report Authenticated By: Charline Bills, M.D.   Mr Brain Wo Contrast  10/06/2011  *RADIOLOGY REPORT*  Clinical Data: Right-sided weakness.  MRI HEAD WITHOUT CONTRAST  Technique:  Multiplanar, multiecho pulse sequences of the brain and surrounding structures were obtained according to standard protocol without intravenous contrast.  Comparison: 10/05/2011 CT.  No comparison MR.  Major intracranial vascular structures are patent.  Paranasal sinus mucosal thickening most notable right maxillary sinus.  Transverse ligament hypertrophy.  Findings: Motion degraded exam.  Scattered acute non hemorrhagic infarcts throughout the left hemisphere involving portions of the left basal ganglia, left frontal lobe, left parietal lobe and left temporal lobe.  Remote small cerebellar infarcts bilaterally.  Remote tiny right thalamic infarct.  Prominent small vessel disease type changes.  Global atrophy without hydrocephalus.  No intracranial hemorrhage.  No intracranial mass lesion detected on this unenhanced exam.  Major intracranial vascular structures appear to be patent.  IMPRESSION: Scattered acute non hemorrhagic infarcts throughout the left hemisphere involving portions of the left basal ganglia, left frontal lobe, left parietal lobe and left temporal lobe.  Please see above.  Original Report Authenticated By: Fuller Canada, M.D.   US Carotid Duplex Bilateral  10/06/2011  *RADIOLOGY REPORT*  Clinical Data: Left-sided scattered acute non hemorrhagic infarction.  BILATERAL CAROTID DUPLEX ULTRASOUND  Technique: Wallace Cullens scale imaging, color Doppler and duplex ultrasound was performed of bilateral carotid and vertebral arteries in the neck.  Comparison:  10/06/2011 MRI  Criteria:  Quantification  of carotid stenosis is based on velocity parameters that correlate the residual internal carotid diameter  with NASCET-based stenosis levels, using the diameter of the distal internal carotid lumen as the denominator for stenosis measurement.  The following velocity measurements were obtained:                   PEAK SYSTOLIC/END DIASTOLIC RIGHT ICA:                        78/10cm/sec CCA:                        91/5cm/sec SYSTOLIC ICA/CCA RATIO:     0.86 DIASTOLIC ICA/CCA RATIO:    2.25 ECA:                        99cm/sec  LEFT ICA:                        231/25cm/sec CCA:                        132/11cm/sec SYSTOLIC ICA/CCA RATIO:     1.74 DIASTOLIC ICA/CCA RATIO:    2.27 ECA:                        101cm/sec  Findings:  RIGHT CAROTID ARTERY: Mild scattered right carotid bifurcation atherosclerosis.  No hemodynamically significant right ICA stenosis, velocity elevation, or turbulent flow.  RIGHT VERTEBRAL ARTERY:  Antegrade  LEFT CAROTID ARTERY: Irregular complex hypoechoic rounded plaque formation in the left carotid bulb extending into the proximal ICA. In this region, there is proximal ICA focal aliasing and velocity elevation with slight turbulent flow.  Left proximal ICA velocity measures 231/25.  By ultrasound criteria, the left ICA stenosis is estimated at 50-70% percent.   The left carotid complex plaque formation could be a possible source of emboli.  LEFT VERTEBRAL ARTERY:  Antegrade  IMPRESSION: Heterogeneous complex left carotid bifurcation/proximal ICA nodular plaque formation, could be a source of emboli.  Associated proximal left ICA stenosis estimated at 50-70%.  Right ICA narrowing less than 50%.  Patent antegrade vertebral flow bilaterally.  Original Report Authenticated By: Judie Petit. Ruel Favors, M.D.    Lab Results: Basic Metabolic Panel:  Basename 10/06/11 0543 10/05/11 1144  NA 139 137  K 4.0 3.7  CL 103 97  CO2 26 30  GLUCOSE 90 108*  BUN 24* 24*  CREATININE 1.06 1.19  CALCIUM 9.7 11.2*  MG -- --  PHOS -- --   Liver Function Tests: No results found for this basename:  AST:2,ALT:2,ALKPHOS:2,BILITOT:2,PROT:2,ALBUMIN:2 in the last 72 hours No results found for this basename: LIPASE:2,AMYLASE:2 in the last 72 hours No results found for this basename: AMMONIA:2 in the last 72 hours CBC:  Basename 10/06/11 0543 10/05/11 1144  WBC 5.0 6.9  NEUTROABS -- --  HGB 13.2 14.4  HCT 40.7 44.4  MCV 88.5 89.2  PLT 219 269    No results found for this or any previous visit (from the past 240 hour(s)).   Hospital Course: He came to the emergency room because he had weakness on the right side. He had difficulty picking up a coffee time it may have had some problem with his speech. He did not have any swallowing difficulty. This was sudden in onset. He eventually came to the emergency room but  actually came after the period where he can have acute intervention done. He improved without any treatment. CT didn't show a definite stroke but MRI showed several small areas. His carotid study showed he had a 50-70% occlusion on the left but also had an area of plaque it may have been a source of emboli  Discharge Exam: Blood pressure 154/88, pulse 74, temperature 98.6 F (37 C), temperature source Oral, resp. rate 24, height 5\' 11"  (1.803 m), weight 65.5 kg (144 lb 6.4 oz), SpO2 92.00%. He is still slightly weak on the right but much improved. His strength is rated as 4/5 in his hand arm and leg on the right now.  Disposition: Home. We discussed home health services he does not want that service. He will also followup in my office in followup in with a vascular surgeon..    Follow-up Information    Follow up with Jacquelin Krajewski L in 2 days. (call for appointment time.)    Contact information:   [address] [place] [address] [phone]          Signed: Fredirick Maudlin 10/07/2011, 8:58 AM

## 2011-10-07 NOTE — Progress Notes (Signed)
Patient discharged with instructions given on medications,and follow up visits,verbalized understanding.Prescriptions sent with patient. No C/O pain or discomfort noted.Accompanied by staff to an awaiting vehicle.

## 2011-10-07 NOTE — Progress Notes (Signed)
NAME:  Clinton Chambers, Clinton Chambers                ACCOUNT NO.:  [id number]  MEDICAL RECORD NO.:  [id number]  LOCATION:  A329                          FACILITY:  [facility]  PHYSICIAN:  Vaudine Dutan A. Gerilyn Pilgrim, M.D. DATE OF BIRTH:  Feb 11, 1926  DATE OF PROCEDURE:  10/07/2011 DATE OF DISCHARGE:  10/07/2011                                PROGRESS NOTE   HISTORY:  The patient reports that the right hand has improved.  No problems are reported involving the right leg and his speech is returned to normal.  PHYSICAL EXAMINATION:  VITAL SIGNS:  Temperature 97.4, heart rate 74, respirations 20, blood pressure 140/70, and satting 92% on room air. GENERAL:  The patient is awake and alert.  Naming is tested and it is good comprehension.  Fluency are good.  Speech is normal.  He is essentially is lucent and coherent. NECK:  Supple. HEENT:  Head is normocephalic and atraumatic. ABDOMEN:  Soft. EXTREMITIES:  No significant edema. NEUROLOGIC:  Cranial evaluation, pupils are reactive.  Visual fields are full.  Extraocular movements are intact.  Motor examination again shows slight pronator drift right upper extremity but this is clearly improved over yesterday.  Fine finger movements also better.  Direct strength testing shows normal tone, bulk, and strength throughout.  MRI is reviewed in person and shows multiple bright signal on fair imaging involving the left MCA distribution.  Areas involved includes the caudate nuclei bate the putaminal nucleus on that side.  The parietal and temporal area, there are multiple signals suggestive of a embolic phenomenon.  Additionally carotid duplex Doppler shows atherosclerotic changes and plaque involving the left ICA with a peak systolic velocity of 231.  ASSESSMENT AND PLAN:  On multiple embolic small infarcts involving the left middle cerebral artery distribution.  This is likely represent a thromboembolic phenomenon involving the left ICA.  The patient is currently on Plavix and  also low-dose enoxaparin.  I did discuss case with Dr. Juanetta Gosling, I think that he should be evaluated for likely endarterectomy.  In the meantime, I will suggest the patient be placed on aspirin and Plavix, and get evaluated.  He also can continue with the statin.     Jevaughn Degollado A. Gerilyn Pilgrim, M.D.     KAD/MEDQ  D:  10/07/2011  T:  10/07/2011  Job:  [id number]

## 2011-10-07 NOTE — Progress Notes (Signed)
Subjective: He says he feels well and wants to go home. He has no new complaints. His strength in his right hand is better but not back to normal. His MRI showed multiple foci of stroke. His carotid study showed that he has some plaque in the carotid may be a source of embolic phenomena.  Objective: Vital signs in last 24 hours: Temp:  [97.4 F (36.3 C)-98.6 F (37 C)] 98.6 F (37 C) (11/20 0521) Pulse Rate:  [53-77] 74  (11/20 0521) Resp:  [20-24] 24  (11/20 0521) BP: (147-170)/(77-95) 154/88 mmHg (11/20 0521) SpO2:  [92 %-93 %] 92 % (11/20 0521) Weight change:  Last BM Date: 10/04/11  Intake/Output from previous day: 11/19 0701 - 11/20 0700 In: 720 [P.O.:720] Out: -   PHYSICAL EXAM General appearance: alert, cooperative and no distress Resp: clear to auscultation bilaterally Cardio: regular rate and rhythm, S1, S2 normal, no murmur, click, rub or gallop GI: soft, non-tender; bowel sounds normal; no masses,  no organomegaly Extremities: extremities normal, atraumatic, no cyanosis or edema  Lab Results:    Basic Metabolic Panel:  Basename 10/06/11 0543 10/05/11 1144  NA 139 137  K 4.0 3.7  CL 103 97  CO2 26 30  GLUCOSE 90 108*  BUN 24* 24*  CREATININE 1.06 1.19  CALCIUM 9.7 11.2*  MG -- --  PHOS -- --   Liver Function Tests: No results found for this basename: AST:2,ALT:2,ALKPHOS:2,BILITOT:2,PROT:2,ALBUMIN:2 in the last 72 hours No results found for this basename: LIPASE:2,AMYLASE:2 in the last 72 hours No results found for this basename: AMMONIA:2 in the last 72 hours CBC:  Basename 10/06/11 0543 10/05/11 1144  WBC 5.0 6.9  NEUTROABS -- --  HGB 13.2 14.4  HCT 40.7 44.4  MCV 88.5 89.2  PLT 219 269   Cardiac Enzymes:  Basename 10/05/11 1144  CKTOTAL 44  CKMB 2.9  CKMBINDEX --  TROPONINI <0.30   BNP: No results found for this basename: POCBNP:3 in the last 72 hours D-Dimer: No results found for this basename: DDIMER:2 in the last 72  hours CBG:  Basename 10/05/11 1153  GLUCAP 93   Hemoglobin A1C: No results found for this basename: HGBA1C in the last 72 hours Fasting Lipid Panel: No results found for this basename: CHOL,HDL,LDLCALC,TRIG,CHOLHDL,LDLDIRECT in the last 72 hours Thyroid Function Tests: No results found for this basename: TSH,T4TOTAL,FREET4,T3FREE,THYROIDAB in the last 72 hours Anemia Panel: No results found for this basename: VITAMINB12,FOLATE,FERRITIN,TIBC,IRON,RETICCTPCT in the last 72 hours Coagulation:  Basename 10/05/11 1144  LABPROT 14.1  INR 1.07   Urine Drug Screen:  Alcohol Level: No results found for this basename: ETH:2 in the last 72 hours Urinalysis:  Misc. Labs:  ABGS No results found for this basename: PHART,PCO2,PO2ART,TCO2,HCO3 in the last 72 hours CULTURES No results found for this or any previous visit (from the past 240 hour(s)). Studies/Results: Ct Head Wo Contrast  10/05/2011  *RADIOLOGY REPORT*  Clinical Data: Right leg/arm weakness, evaluate for stroke  CT HEAD WITHOUT CONTRAST  Technique:  Contiguous axial images were obtained from the base of the skull through the vertex without contrast.  Comparison: 09/27/2011  Findings: No evidence of parenchymal hemorrhage or extra-axial fluid collection. No mass lesion, mass effect, or midline shift.  No CT evidence of acute infarction.  Extensive subcortical white matter and periventricular small vessel ischemic changes, including the subcortical left frontal lobe, unchanged.  Intracranial atherosclerosis.  Global cortical atrophy.  The visualized paranasal sinuses are essentially clear. The mastoid air cells are unopacified.  No  evidence of calvarial fracture.  IMPRESSION: No evidence of acute intracranial abnormality.  Atrophy with extensive small vessel ischemic changes and intracranial atherosclerosis.  Original Report Authenticated By: Charline Bills, M.D.   Mr Brain Wo Contrast  10/06/2011  *RADIOLOGY REPORT*  Clinical  Data: Right-sided weakness.  MRI HEAD WITHOUT CONTRAST  Technique:  Multiplanar, multiecho pulse sequences of the brain and surrounding structures were obtained according to standard protocol without intravenous contrast.  Comparison: 10/05/2011 CT.  No comparison MR.  Major intracranial vascular structures are patent.  Paranasal sinus mucosal thickening most notable right maxillary sinus.  Transverse ligament hypertrophy.  Findings: Motion degraded exam.  Scattered acute non hemorrhagic infarcts throughout the left hemisphere involving portions of the left basal ganglia, left frontal lobe, left parietal lobe and left temporal lobe.  Remote small cerebellar infarcts bilaterally.  Remote tiny right thalamic infarct.  Prominent small vessel disease type changes.  Global atrophy without hydrocephalus.  No intracranial hemorrhage.  No intracranial mass lesion detected on this unenhanced exam.  Major intracranial vascular structures appear to be patent.  IMPRESSION: Scattered acute non hemorrhagic infarcts throughout the left hemisphere involving portions of the left basal ganglia, left frontal lobe, left parietal lobe and left temporal lobe.  Please see above.  Original Report Authenticated By: Fuller Canada, M.D.   US Carotid Duplex Bilateral  10/06/2011  *RADIOLOGY REPORT*  Clinical Data: Left-sided scattered acute non hemorrhagic infarction.  BILATERAL CAROTID DUPLEX ULTRASOUND  Technique: Wallace Cullens scale imaging, color Doppler and duplex ultrasound was performed of bilateral carotid and vertebral arteries in the neck.  Comparison:  10/06/2011 MRI  Criteria:  Quantification of carotid stenosis is based on velocity parameters that correlate the residual internal carotid diameter with NASCET-based stenosis levels, using the diameter of the distal internal carotid lumen as the denominator for stenosis measurement.  The following velocity measurements were obtained:                   PEAK SYSTOLIC/END DIASTOLIC RIGHT  ICA:                        78/10cm/sec CCA:                        91/5cm/sec SYSTOLIC ICA/CCA RATIO:     0.86 DIASTOLIC ICA/CCA RATIO:    2.25 ECA:                        99cm/sec  LEFT ICA:                        231/25cm/sec CCA:                        132/11cm/sec SYSTOLIC ICA/CCA RATIO:     1.74 DIASTOLIC ICA/CCA RATIO:    2.27 ECA:                        101cm/sec  Findings:  RIGHT CAROTID ARTERY: Mild scattered right carotid bifurcation atherosclerosis.  No hemodynamically significant right ICA stenosis, velocity elevation, or turbulent flow.  RIGHT VERTEBRAL ARTERY:  Antegrade  LEFT CAROTID ARTERY: Irregular complex hypoechoic rounded plaque formation in the left carotid bulb extending into the proximal ICA. In this region, there is proximal ICA focal aliasing and velocity elevation with slight turbulent flow.  Left proximal ICA velocity measures 231/25.  By  ultrasound criteria, the left ICA stenosis is estimated at 50-70% percent.   The left carotid complex plaque formation could be a possible source of emboli.  LEFT VERTEBRAL ARTERY:  Antegrade  IMPRESSION: Heterogeneous complex left carotid bifurcation/proximal ICA nodular plaque formation, could be a source of emboli.  Associated proximal left ICA stenosis estimated at 50-70%.  Right ICA narrowing less than 50%.  Patent antegrade vertebral flow bilaterally.  Original Report Authenticated By: Judie Petit. Ruel Favors, M.D.    Medications:  Prior to Admission:  Prescriptions prior to admission  Medication Sig Dispense Refill  . Ascorbic Acid (VITAMIN C) 100 MG tablet Take 100 mg by mouth daily.        Marland Kitchen aspirin EC 81 MG tablet Take 81 mg by mouth daily.        . Cholecalciferol (VITAMIN D PO) Take 1 tablet by mouth daily. Unsure of strength       Scheduled:   . clopidogrel  75 mg Oral Q breakfast  . enoxaparin  40 mg Subcutaneous Q24H  . ramipril  2.5 mg Oral Daily  . rosuvastatin  40 mg Oral q1800  . sodium chloride  3 mL Intravenous Q12H    Continuous:   . sodium chloride 100 mL/hr at 10/05/11 1151  . sodium chloride     ZOX:[id number], acetaminophen, alum & mag hydroxide-simeth, ondansetron (ZOFRAN) IV, ondansetron, senna, sodium chloride, traZODone  Assesment: He had a stroke. He has a problem on the left carotid there may be a source of emboli. I discussed the situation with Dr. Darrick Penna the vascular surgeon and Mr. Tallman will be evaluated within the next several days to see if he needs to have surgery or not. Principal Problem:  *TIA (transient ischemic attack)    Plan: He will be discharged home. Please see discharge summary for details    LOS: 2 days   Aime Meloche L 10/07/2011, 8:55 AM

## 2011-10-13 ENCOUNTER — Encounter: Payer: Self-pay | Admitting: Vascular Surgery

## 2011-10-13 ENCOUNTER — Other Ambulatory Visit: Payer: Self-pay

## 2011-10-13 DIAGNOSIS — G459 Transient cerebral ischemic attack, unspecified: Secondary | ICD-10-CM

## 2011-10-14 ENCOUNTER — Ambulatory Visit (INDEPENDENT_AMBULATORY_CARE_PROVIDER_SITE_OTHER): Payer: Medicare Other | Admitting: Vascular Surgery

## 2011-10-14 DIAGNOSIS — I6529 Occlusion and stenosis of unspecified carotid artery: Secondary | ICD-10-CM

## 2011-10-14 DIAGNOSIS — G459 Transient cerebral ischemic attack, unspecified: Secondary | ICD-10-CM

## 2011-10-14 NOTE — Progress Notes (Signed)
Carotid duplex performed [facility] 10/14/2011

## 2011-10-15 ENCOUNTER — Encounter: Payer: Self-pay | Admitting: Vascular Surgery

## 2011-10-16 ENCOUNTER — Encounter: Payer: Self-pay | Admitting: Vascular Surgery

## 2011-10-16 ENCOUNTER — Encounter (HOSPITAL_COMMUNITY): Payer: Self-pay | Admitting: Pharmacy Technician

## 2011-10-16 ENCOUNTER — Ambulatory Visit (INDEPENDENT_AMBULATORY_CARE_PROVIDER_SITE_OTHER): Payer: Medicare Other | Admitting: Vascular Surgery

## 2011-10-16 ENCOUNTER — Other Ambulatory Visit: Payer: Self-pay | Admitting: *Deleted

## 2011-10-16 VITALS — BP 150/70 | HR 87 | Resp 16 | Ht 70.0 in | Wt 142.0 lb

## 2011-10-16 DIAGNOSIS — I6529 Occlusion and stenosis of unspecified carotid artery: Secondary | ICD-10-CM

## 2011-10-16 NOTE — Progress Notes (Signed)
History of Present Illness:  Patient is a 77 y.o. year old male who presents for evaluation of carotid stenosis.  Speech. He recovered after approximate 72 hours. He has had no prior stroke or TIA events prior to this. He denies history of atrial fibrillation. He denies history of chest pain. He denies history of amaurosis. His atherosclerotic risk factors include hypertension, elevated cholesterol, and remote tobacco history. These medical problems are currently followed by Dr. Hawkins and are stable. He is currently on antiplatelet therapy in the form of Plavix and aspirin. He was on aspirin alone prior to this event.  Past Medical History  Diagnosis Date  . Arthritis   . Dizziness     Past Surgical History  Procedure Date  . Cataract extraction      Social History History  Substance Use Topics  . Smoking status: Former Smoker  . Smokeless tobacco: Not on file  . Alcohol Use: No    Family History History reviewed. No pertinent family history.  Allergies  Allergies  Allergen Reactions  . Vicodin (Hydrocodone-Acetaminophen) Other (See Comments)    'too strong' causes me to 'fall out'     Current Outpatient Prescriptions  Medication Sig Dispense Refill  . Ascorbic Acid (VITAMIN C) 100 MG tablet Take 100 mg by mouth daily.        . aspirin EC 81 MG tablet Take 81 mg by mouth daily.        . Cholecalciferol (VITAMIN D PO) Take 1 tablet by mouth daily. Unsure of strength      . clopidogrel (PLAVIX) 75 MG tablet Take 1 tablet (75 mg total) by mouth daily with breakfast.  30 tablet  12  . ramipril (ALTACE) 2.5 MG capsule Take 1 capsule (2.5 mg total) by mouth daily.  30 capsule  12  . rosuvastatin (CRESTOR) 40 MG tablet Take 1 tablet (40 mg total) by mouth daily at 6 PM.  40 tablet  12    ROS:   General:  No weight loss, Fever, chills  HEENT: No recent headaches, no nasal bleeding, no visual changes, no sore throat  Neurologic: No dizziness, blackouts, seizures. No recent  symptoms of stroke or mini- stroke. No recent episodes of slurred speech, or temporary blindness.  Cardiac: No recent episodes of chest pain/pressure, no shortness of breath at rest.  No shortness of breath with exertion.  Denies history of atrial fibrillation or irregular heartbeat  Vascular: No history of rest pain in feet.  No history of claudication.  No history of non-healing ulcer, No history of DVT   Pulmonary: No home oxygen, no productive cough, no hemoptysis,  No asthma or wheezing  Musculoskeletal:  [ ] Arthritis, [ ] Low back pain,  [ ] Joint pain  Hematologic:No history of hypercoagulable state.  No history of easy bleeding.  No history of anemia  Gastrointestinal: No hematochezia or melena,  No gastroesophageal reflux, no trouble swallowing  Urinary: [ ] chronic Kidney disease, [ ] on HD - [ ] MWF or [ ] TTHS, [ ] Burning with urination, [ ] Frequent urination, [ ] Difficulty urinating;   Skin: No rashes  Psychological: No history of anxiety,  No history of depression   Physical Examination  Filed Vitals:   10/16/11 0917 10/16/11 0918  BP: 130/70 150/70  Pulse: 88 87  Resp: 16   Height: 5' 10" (1.778 m)   Weight: 142 lb (64.411 kg)   SpO2: 98% 98%    Body mass index is   20.37 kg/(m^2).  General:  Alert and oriented, no acute distress HEENT: Normal Neck: No bruit or JVD Pulmonary: Clear to auscultation bilaterally Cardiac: Regular Rate and Rhythm without murmur Gastrointestinal: Soft, non-tender, non-distended, no mass, no scars Skin: No rash Extremity Pulses:  2+ radial, brachial, femoral, pulses bilaterally Musculoskeletal: No deformity or edema  Neurologic: Upper and lower extremity motor 5/5 and symmetric  DATA: He had a carotid duplex exam today which I reviewed and interpreted. This showed minimal less than 40% stenosis of the right internal carotid artery he had a 60-80% left internal carotid artery stenosis vertebral flow was antegrade  bilaterally   ASSESSMENT:  Symptomatic left internal carotid artery stenosis with prior stroke symptoms now resolved  PLAN: Left carotid endarterectomy on Tuesday, 10/21/2011  I discussed with the patient the risks of carotid endarterectomy including but not limited to myocardial events 5%, cranial nerve injury 10-15%, stroke 2-5%, bleeding or infection 1%  I also discussed the benefits of long term stroke prevention and the advantage compared to medical therapy  The patient is aware of the risks and agrees to proceed forward with the procedure.  Lucky Trotta, MD [facility] Office: [phone] Pager: [phone]  

## 2011-10-17 NOTE — Procedures (Unsigned)
CAROTID DUPLEX EXAM  INDICATION:  Carotid stenosis, transient ischemic attack  HISTORY: Diabetes:  No Cardiac:  No Hypertension:  No Smoking:  Previous Previous Surgery:  No carotid intervention CV History:  Currently asymptomatic, minor CVA on 10/05/2011 Amaurosis Fugax No, Paresthesias No, Hemiparesis No                                      RIGHT             LEFT Brachial systolic pressure:         158               176 Brachial Doppler waveforms:         WNL               WNL Vertebral direction of flow:        Antegrade         Antegrade DUPLEX VELOCITIES (cm/sec) CCA peak systolic                   78                108 ECA peak systolic                   63                96 ICA peak systolic                   50                225 ICA end diastolic                   14                74 PLAQUE MORPHOLOGY:                  Heterogeneous     Homogeneous PLAQUE AMOUNT:                      Mild              Moderate to severe PLAQUE LOCATION:                    CCA/ICA           CCA/ICA  IMPRESSION: 1. Right internal carotid artery stenosis present in the 1%-39% range. 2. Left internal carotid artery stenosis present in the 60%-79% range,     with soft plaque present. 3. Bilateral external carotid arteries appear patent. 4. Bilateral vertebral arteries are patent and antegrade.  ___________________________________________ Janetta Hora. Fields, MD  SH/MEDQ  D:  10/14/2011  T:  10/14/2011  Job:  [id number]

## 2011-10-20 ENCOUNTER — Encounter (HOSPITAL_COMMUNITY)
Admission: RE | Admit: 2011-10-20 | Discharge: 2011-10-20 | Disposition: A | Discharge status: Home / Self Care | Payer: Medicare Other | Source: Ambulatory Visit | Attending: Vascular Surgery | Admitting: Vascular Surgery

## 2011-10-20 ENCOUNTER — Encounter (HOSPITAL_COMMUNITY): Payer: Self-pay

## 2011-10-20 ENCOUNTER — Other Ambulatory Visit: Payer: Self-pay

## 2011-10-20 ENCOUNTER — Encounter (HOSPITAL_COMMUNITY)
Admission: RE | Admit: 2011-10-20 | Discharge: 2011-10-20 | Disposition: A | Discharge status: Home / Self Care | Payer: Medicare Other | Source: Ambulatory Visit | Attending: Anesthesiology | Admitting: Anesthesiology

## 2011-10-20 HISTORY — DX: Cerebral infarction, unspecified: I63.9

## 2011-10-20 HISTORY — DX: Gastro-esophageal reflux disease without esophagitis: K21.9

## 2011-10-20 HISTORY — DX: Essential (primary) hypertension: I10

## 2011-10-20 LAB — COMPREHENSIVE METABOLIC PANEL
ALT: 12 U/L (ref 0–53)
AST: 19 U/L (ref 0–37)
Alkaline Phosphatase: 63 U/L (ref 39–117)
CO2: 30 mEq/L (ref 19–32)
Calcium: 10.4 mg/dL (ref 8.4–10.5)
Chloride: 103 mEq/L (ref 96–112)
GFR calc Af Amer: 61 mL/min — ABNORMAL LOW (ref >90)
GFR calc non Af Amer: 53 mL/min — ABNORMAL LOW (ref >90)
Glucose, Bld: 106 mg/dL — ABNORMAL HIGH (ref 70–99)
Potassium: 4.2 mEq/L (ref 3.5–5.1)
Sodium: 144 mEq/L (ref 135–145)

## 2011-10-20 LAB — DIFFERENTIAL
Basophils Absolute: 0 10*3/uL (ref 0.0–0.1)
Eosinophils Absolute: 0.5 10*3/uL (ref 0.0–0.7)
Eosinophils Relative: 7 % — ABNORMAL HIGH (ref 0–5)
Lymphocytes Relative: 21 % (ref 12–46)
Monocytes Absolute: 1 10*3/uL (ref 0.1–1.0)

## 2011-10-20 LAB — CBC
Hemoglobin: 13.2 g/dL (ref 13.0–17.0)
MCH: 28.2 pg (ref 26.0–34.0)
Platelets: 231 10*3/uL (ref 150–400)
RBC: 4.68 MIL/uL (ref 4.22–5.81)
WBC: 7.6 10*3/uL (ref 4.0–10.5)

## 2011-10-20 LAB — SURGICAL PCR SCREEN
MRSA, PCR: NEGATIVE
Staphylococcus aureus: NEGATIVE

## 2011-10-20 LAB — PROTIME-INR: INR: 1.01 (ref 0.00–1.49)

## 2011-10-20 MED ORDER — DEXTROSE 5 % IV SOLN
1.5000 g | INTRAVENOUS | Status: AC
  Administered 2011-10-21: 1.5 g via INTRAVENOUS
  Filled 2011-10-20: qty 1.5

## 2011-10-20 NOTE — Pre-Procedure Instructions (Signed)
[name]  10/20/2011   Your procedure is scheduled on:  10/21/2011  Report to [facility] at 5:30 AM.  Call this number if you have problems the morning of surgery: [phone]   Remember:   Do not eat food:After Midnight.  May have clear liquids: up to 4 Hours before arrival.  Clear liquids include soda, tea, black coffee, apple or grape juice, broth.  Take these medicines the morning of surgery with A SIP OF WATER: none   Do not wear jewelry, make-up or nail polish.  Do not wear lotions, powders, or perfumes. You may wear deodorant.  Do not shave 48 hours prior to surgery.  Do not bring valuables to the hospital.  Contacts, dentures or bridgework may not be worn into surgery.  Leave suitcase in the car. After surgery it may be brought to your room.  For patients admitted to the hospital, checkout time is 11:00 AM the day of discharge.   Patients discharged the day of surgery will not be allowed to drive home.  Name and phone number of your driver:   Special Instructions: CHG Shower Use Special Wash: 1/2 bottle night before surgery and 1/2 bottle morning of surgery.   Please read over the following fact sheets that you were given: Pain Booklet, Coughing and Deep Breathing, Blood Transfusion Information, MRSA Information and Surgical Site Infection Prevention

## 2011-10-21 ENCOUNTER — Other Ambulatory Visit: Payer: Self-pay

## 2011-10-21 ENCOUNTER — Inpatient Hospital Stay (HOSPITAL_COMMUNITY)
Admission: RE | Admit: 2011-10-21 | Discharge: 2011-10-23 | DRG: 038 | Disposition: A | Discharge status: Home / Self Care | Payer: Medicare Other | Source: Ambulatory Visit | Attending: Vascular Surgery | Admitting: Vascular Surgery

## 2011-10-21 ENCOUNTER — Other Ambulatory Visit: Payer: Self-pay | Admitting: Vascular Surgery

## 2011-10-21 ENCOUNTER — Inpatient Hospital Stay (HOSPITAL_COMMUNITY): Payer: Medicare Other | Admitting: Anesthesiology

## 2011-10-21 ENCOUNTER — Encounter (HOSPITAL_COMMUNITY): Payer: Self-pay | Admitting: Anesthesiology

## 2011-10-21 ENCOUNTER — Encounter (HOSPITAL_COMMUNITY): Payer: Self-pay | Admitting: *Deleted

## 2011-10-21 ENCOUNTER — Encounter (HOSPITAL_COMMUNITY)
Admission: RE | Disposition: A | Discharge status: Home / Self Care | Payer: Self-pay | Source: Ambulatory Visit | Attending: Vascular Surgery

## 2011-10-21 DIAGNOSIS — Z8673 Personal history of transient ischemic attack (TIA), and cerebral infarction without residual deficits: Secondary | ICD-10-CM

## 2011-10-21 DIAGNOSIS — I6529 Occlusion and stenosis of unspecified carotid artery: Secondary | ICD-10-CM

## 2011-10-21 DIAGNOSIS — Z7982 Long term (current) use of aspirin: Secondary | ICD-10-CM

## 2011-10-21 DIAGNOSIS — I10 Essential (primary) hypertension: Secondary | ICD-10-CM | POA: Diagnosis present

## 2011-10-21 DIAGNOSIS — Z7902 Long term (current) use of antithrombotics/antiplatelets: Secondary | ICD-10-CM

## 2011-10-21 DIAGNOSIS — I4729 Other ventricular tachycardia: Secondary | ICD-10-CM | POA: Diagnosis not present

## 2011-10-21 DIAGNOSIS — Z79899 Other long term (current) drug therapy: Secondary | ICD-10-CM

## 2011-10-21 DIAGNOSIS — D62 Acute posthemorrhagic anemia: Secondary | ICD-10-CM | POA: Diagnosis not present

## 2011-10-21 DIAGNOSIS — Z87891 Personal history of nicotine dependence: Secondary | ICD-10-CM

## 2011-10-21 DIAGNOSIS — E7800 Pure hypercholesterolemia, unspecified: Secondary | ICD-10-CM | POA: Diagnosis present

## 2011-10-21 DIAGNOSIS — I472 Ventricular tachycardia, unspecified: Secondary | ICD-10-CM | POA: Diagnosis not present

## 2011-10-21 DIAGNOSIS — K219 Gastro-esophageal reflux disease without esophagitis: Secondary | ICD-10-CM | POA: Diagnosis present

## 2011-10-21 DIAGNOSIS — Z01812 Encounter for preprocedural laboratory examination: Secondary | ICD-10-CM

## 2011-10-21 DIAGNOSIS — Z01818 Encounter for other preprocedural examination: Secondary | ICD-10-CM

## 2011-10-21 HISTORY — PX: ENDARTERECTOMY: SHX5162

## 2011-10-21 HISTORY — PX: CAROTID ENDARTERECTOMY: SUR193

## 2011-10-21 SURGERY — ENDARTERECTOMY, CAROTID
Anesthesia: General | Site: Neck | Laterality: Left | Wound class: Clean

## 2011-10-21 MED ORDER — ROCURONIUM BROMIDE 100 MG/10ML IV SOLN
INTRAVENOUS | Status: DC | PRN
  Administered 2011-10-21: 50 mg via INTRAVENOUS

## 2011-10-21 MED ORDER — MIDAZOLAM HCL 2 MG/2ML IJ SOLN
0.5000 mg | Freq: Once | INTRAMUSCULAR | Status: AC | PRN
  Administered 2011-10-21: 0.5 mg via INTRAVENOUS

## 2011-10-21 MED ORDER — THROMBIN 20000 UNITS EX KIT
PACK | CUTANEOUS | Status: DC | PRN
  Administered 2011-10-21: 10:00:00 via TOPICAL

## 2011-10-21 MED ORDER — PHENOL 1.4 % MT LIQD
1.0000 | OROMUCOSAL | Status: DC | PRN
  Administered 2011-10-21: 1 via OROMUCOSAL
  Filled 2011-10-21: qty 177

## 2011-10-21 MED ORDER — OXYCODONE HCL 5 MG PO TABS: 5.0000 mg | ORAL_TABLET | Freq: Four times a day (QID) | ORAL | Status: DC | PRN

## 2011-10-21 MED ORDER — MORPHINE SULFATE 2 MG/ML IJ SOLN: 2.0000 mg | INTRAMUSCULAR | Status: DC | PRN

## 2011-10-21 MED ORDER — RAMIPRIL 2.5 MG PO CAPS
2.5000 mg | ORAL_CAPSULE | Freq: Every day | ORAL | Status: DC
Start: 2011-10-21 — End: 2011-10-23
  Administered 2011-10-21 – 2011-10-23 (×3): 2.5 mg via ORAL
  Filled 2011-10-21 (×3): qty 1

## 2011-10-21 MED ORDER — ROSUVASTATIN CALCIUM 40 MG PO TABS
40.0000 mg | ORAL_TABLET | Freq: Every day | ORAL | Status: DC
  Administered 2011-10-21 – 2011-10-22 (×2): 40 mg via ORAL
  Filled 2011-10-21 (×3): qty 1

## 2011-10-21 MED ORDER — GLYCOPYRROLATE 0.2 MG/ML IJ SOLN
INTRAMUSCULAR | Status: DC | PRN
  Administered 2011-10-21: .6 mg via INTRAVENOUS

## 2011-10-21 MED ORDER — METOPROLOL TARTRATE 1 MG/ML IV SOLN: 2.0000 mg | INTRAVENOUS | Status: DC | PRN

## 2011-10-21 MED ORDER — MAGNESIUM SULFATE 40 MG/ML IJ SOLN
2.0000 g | Freq: Once | INTRAMUSCULAR | Status: AC | PRN
  Filled 2011-10-21: qty 50

## 2011-10-21 MED ORDER — HEPARIN SODIUM (PORCINE) 1000 UNIT/ML IJ SOLN
INTRAMUSCULAR | Status: DC | PRN
  Administered 2011-10-21: 6000 [IU] via INTRAVENOUS
  Administered 2011-10-21: 5000 [IU] via INTRAVENOUS

## 2011-10-21 MED ORDER — LACTATED RINGERS IV SOLN: INTRAVENOUS | Status: DC

## 2011-10-21 MED ORDER — ASPIRIN EC 81 MG PO TBEC
81.0000 mg | DELAYED_RELEASE_TABLET | Freq: Every day | ORAL | Status: DC
  Administered 2011-10-21 – 2011-10-23 (×3): 81 mg via ORAL
  Filled 2011-10-21 (×3): qty 1

## 2011-10-21 MED ORDER — PROTAMINE SULFATE 10 MG/ML IV SOLN
INTRAVENOUS | Status: DC | PRN
  Administered 2011-10-21: 30 mg via INTRAVENOUS
  Administered 2011-10-21: 80 mg via INTRAVENOUS

## 2011-10-21 MED ORDER — PROPOFOL 10 MG/ML IV EMUL
INTRAVENOUS | Status: DC | PRN
  Administered 2011-10-21: 10 mg via INTRAVENOUS
  Administered 2011-10-21 (×2): 20 mg via INTRAVENOUS
  Administered 2011-10-21: 130 mg via INTRAVENOUS

## 2011-10-21 MED ORDER — HYDRALAZINE HCL 20 MG/ML IJ SOLN
10.0000 mg | INTRAMUSCULAR | Status: DC | PRN
  Filled 2011-10-21: qty 0.5

## 2011-10-21 MED ORDER — MORPHINE SULFATE 2 MG/ML IJ SOLN
INTRAMUSCULAR | Status: AC
  Filled 2011-10-21: qty 1

## 2011-10-21 MED ORDER — DOCUSATE SODIUM 100 MG PO CAPS
100.0000 mg | ORAL_CAPSULE | Freq: Every day | ORAL | Status: DC
  Administered 2011-10-22 – 2011-10-23 (×2): 100 mg via ORAL
  Filled 2011-10-21 (×2): qty 1

## 2011-10-21 MED ORDER — DEXTROSE 5 % IV SOLN
1.5000 g | Freq: Two times a day (BID) | INTRAVENOUS | Status: AC
  Administered 2011-10-21 – 2011-10-22 (×2): 1.5 g via INTRAVENOUS
  Filled 2011-10-21 (×2): qty 1.5

## 2011-10-21 MED ORDER — FENTANYL CITRATE 0.05 MG/ML IJ SOLN
INTRAMUSCULAR | Status: DC | PRN
  Administered 2011-10-21 (×5): 50 ug via INTRAVENOUS

## 2011-10-21 MED ORDER — ACETAMINOPHEN 325 MG PO TABS
325.0000 mg | ORAL_TABLET | ORAL | Status: DC | PRN
  Administered 2011-10-22: 650 mg via ORAL
  Filled 2011-10-21 (×2): qty 2

## 2011-10-21 MED ORDER — SODIUM CHLORIDE 0.9 % IV SOLN: INTRAVENOUS | Status: DC

## 2011-10-21 MED ORDER — SODIUM CHLORIDE 0.9 % IR SOLN
Status: DC | PRN
  Administered 2011-10-21: 1000 mL

## 2011-10-21 MED ORDER — HYDROMORPHONE HCL PF 1 MG/ML IJ SOLN
0.2500 mg | INTRAMUSCULAR | Status: DC | PRN
  Administered 2011-10-21 (×2): 0.5 mg via INTRAVENOUS
  Administered 2011-10-21 (×2): 0.25 mg via INTRAVENOUS
  Administered 2011-10-21: 0.5 mg via INTRAVENOUS

## 2011-10-21 MED ORDER — HETASTARCH-ELECTROLYTES 6 % IV SOLN
INTRAVENOUS | Status: DC | PRN
  Administered 2011-10-21: 09:00:00 via INTRAVENOUS

## 2011-10-21 MED ORDER — FAMOTIDINE IN NACL 20-0.9 MG/50ML-% IV SOLN
20.0000 mg | Freq: Two times a day (BID) | INTRAVENOUS | Status: DC
  Administered 2011-10-21 – 2011-10-22 (×3): 20 mg via INTRAVENOUS
  Filled 2011-10-21 (×5): qty 50

## 2011-10-21 MED ORDER — KCL IN DEXTROSE-NACL 20-5-0.45 MEQ/L-%-% IV SOLN
INTRAVENOUS | Status: DC
Start: 2011-10-21 — End: 2011-10-23
  Administered 2011-10-21: 20:00:00 via INTRAVENOUS
  Filled 2011-10-21 (×4): qty 1000

## 2011-10-21 MED ORDER — PHENYLEPHRINE HCL 10 MG/ML IJ SOLN
10.0000 mg | INTRAVENOUS | Status: DC | PRN
  Administered 2011-10-21: 60 ug/min via INTRAVENOUS

## 2011-10-21 MED ORDER — ALUM & MAG HYDROXIDE-SIMETH 400-400-40 MG/5ML PO SUSP
15.0000 mL | ORAL | Status: DC | PRN
  Filled 2011-10-21: qty 30

## 2011-10-21 MED ORDER — MEPERIDINE HCL 25 MG/ML IJ SOLN: 6.2500 mg | INTRAMUSCULAR | Status: DC | PRN

## 2011-10-21 MED ORDER — MORPHINE SULFATE 2 MG/ML IJ SOLN: 0.0500 mg/kg | INTRAMUSCULAR | Status: DC | PRN

## 2011-10-21 MED ORDER — POTASSIUM CHLORIDE CRYS ER 20 MEQ PO TBCR: 20.0000 meq | EXTENDED_RELEASE_TABLET | Freq: Once | ORAL | Status: AC | PRN

## 2011-10-21 MED ORDER — DOPAMINE-DEXTROSE 3.2-5 MG/ML-% IV SOLN: 3.0000 ug/kg/min | INTRAVENOUS | Status: DC

## 2011-10-21 MED ORDER — OXYCODONE HCL 5 MG PO TABS
5.0000 mg | ORAL_TABLET | ORAL | Status: DC | PRN
  Administered 2011-10-22: 10 mg via ORAL
  Filled 2011-10-21: qty 2

## 2011-10-21 MED ORDER — SODIUM CHLORIDE 0.9 % IV SOLN: 500.0000 mL | Freq: Once | INTRAVENOUS | Status: AC | PRN

## 2011-10-21 MED ORDER — VITAMIN C 250 MG PO TABS
250.0000 mg | ORAL_TABLET | Freq: Every day | ORAL | Status: DC
  Administered 2011-10-21 – 2011-10-23 (×3): 250 mg via ORAL
  Filled 2011-10-21 (×3): qty 1

## 2011-10-21 MED ORDER — ACETAMINOPHEN 650 MG RE SUPP: 325.0000 mg | RECTAL | Status: DC | PRN

## 2011-10-21 MED ORDER — GUAIFENESIN-DM 100-10 MG/5ML PO SYRP: 15.0000 mL | ORAL_SOLUTION | ORAL | Status: DC | PRN

## 2011-10-21 MED ORDER — NEOSTIGMINE METHYLSULFATE 1 MG/ML IJ SOLN
INTRAMUSCULAR | Status: DC | PRN
  Administered 2011-10-21: 4 mg via INTRAVENOUS

## 2011-10-21 MED ORDER — ASPIRIN EC 325 MG PO TBEC: 325.0000 mg | DELAYED_RELEASE_TABLET | Freq: Every day | ORAL | Status: DC

## 2011-10-21 MED ORDER — LACTATED RINGERS IV SOLN
INTRAVENOUS | Status: DC | PRN
  Administered 2011-10-21 (×3): via INTRAVENOUS

## 2011-10-21 MED ORDER — TEMAZEPAM 15 MG PO CAPS: 15.0000 mg | ORAL_CAPSULE | Freq: Every evening | ORAL | Status: DC | PRN

## 2011-10-21 MED ORDER — ONDANSETRON HCL 4 MG/2ML IJ SOLN: 4.0000 mg | Freq: Four times a day (QID) | INTRAMUSCULAR | Status: DC | PRN

## 2011-10-21 MED ORDER — PROMETHAZINE HCL 25 MG/ML IJ SOLN: 6.2500 mg | INTRAMUSCULAR | Status: DC | PRN

## 2011-10-21 MED ORDER — SODIUM CHLORIDE 0.9 % IR SOLN
Status: DC | PRN
  Administered 2011-10-21: 09:00:00

## 2011-10-21 MED ORDER — VITAMIN C 100 MG PO TABS: 100.0000 mg | ORAL_TABLET | Freq: Every day | ORAL | Status: DC

## 2011-10-21 MED ORDER — ONDANSETRON HCL 4 MG/2ML IJ SOLN
INTRAMUSCULAR | Status: DC | PRN
  Administered 2011-10-21: 4 mg via INTRAVENOUS

## 2011-10-21 MED ORDER — LABETALOL HCL 5 MG/ML IV SOLN: 10.0000 mg | INTRAVENOUS | Status: DC | PRN

## 2011-10-21 SURGICAL SUPPLY — 59 items
ADH SKN CLS APL DERMABOND .7 (GAUZE/BANDAGES/DRESSINGS) ×2
CANISTER SUCTION 2500CC (MISCELLANEOUS) ×2 IMPLANT
CANNULA VESSEL W/WING WO/VALVE (CANNULA) ×3 IMPLANT
CATH ROBINSON RED A/P 18FR (CATHETERS) ×2 IMPLANT
CATH SUCT 10FR WHISTLE TIP (CATHETERS) ×1 IMPLANT
CLIP TI MEDIUM 24 (CLIP) ×2 IMPLANT
CLIP TI WIDE RED SMALL 24 (CLIP) ×2 IMPLANT
CLOTH BEACON ORANGE TIMEOUT ST (SAFETY) ×2 IMPLANT
COVER SURGICAL LIGHT HANDLE (MISCELLANEOUS) ×4 IMPLANT
CRADLE DONUT ADULT HEAD (MISCELLANEOUS) ×2 IMPLANT
DECANTER SPIKE VIAL GLASS SM (MISCELLANEOUS) IMPLANT
DERMABOND ADVANCED (GAUZE/BANDAGES/DRESSINGS) ×2
DERMABOND ADVANCED .7 DNX12 (GAUZE/BANDAGES/DRESSINGS) ×1 IMPLANT
DRAIN HEMOVAC 1/8 X 5 (WOUND CARE) IMPLANT
DRAIN JACKSON PRATT 10MM FLAT (MISCELLANEOUS) ×1 IMPLANT
DRAPE WARM FLUID 44X44 (DRAPE) ×2 IMPLANT
DRSG COVADERM 4X8 (GAUZE/BANDAGES/DRESSINGS) ×1 IMPLANT
ELECT REM PT RETURN 9FT ADLT (ELECTROSURGICAL) ×2
ELECTRODE REM PT RTRN 9FT ADLT (ELECTROSURGICAL) ×1 IMPLANT
EVACUATOR SILICONE 100CC (DRAIN) IMPLANT
GEL ULTRASOUND 20GR AQUASONIC (MISCELLANEOUS) IMPLANT
GLOVE BIO SURGEON STRL SZ 6.5 (GLOVE) ×1 IMPLANT
GLOVE BIO SURGEON STRL SZ7.5 (GLOVE) ×2 IMPLANT
GLOVE BIOGEL PI IND STRL 6.5 (GLOVE) IMPLANT
GLOVE BIOGEL PI IND STRL 7.0 (GLOVE) IMPLANT
GLOVE BIOGEL PI IND STRL 7.5 (GLOVE) IMPLANT
GLOVE BIOGEL PI INDICATOR 6.5 (GLOVE) ×2
GLOVE BIOGEL PI INDICATOR 7.0 (GLOVE) ×2
GLOVE BIOGEL PI INDICATOR 7.5 (GLOVE) ×2
GLOVE ECLIPSE 6.5 STRL STRAW (GLOVE) ×1 IMPLANT
GLOVE SURG SS PI 7.5 STRL IVOR (GLOVE) ×2 IMPLANT
GOWN PREVENTION PLUS XLARGE (GOWN DISPOSABLE) ×4 IMPLANT
GOWN STRL NON-REIN LRG LVL3 (GOWN DISPOSABLE) ×4 IMPLANT
KIT BASIN OR (CUSTOM PROCEDURE TRAY) ×2 IMPLANT
KIT ROOM TURNOVER OR (KITS) ×2 IMPLANT
LOOP VESSEL MINI RED (MISCELLANEOUS) IMPLANT
NDL HYPO 25GX1X1/2 BEV (NEEDLE) IMPLANT
NEEDLE HYPO 25GX1X1/2 BEV (NEEDLE) IMPLANT
NS IRRIG 1000ML POUR BTL (IV SOLUTION) ×4 IMPLANT
PACK CAROTID (CUSTOM PROCEDURE TRAY) ×2 IMPLANT
PAD ARMBOARD 7.5X6 YLW CONV (MISCELLANEOUS) ×4 IMPLANT
PATCH HEMASHIELD 8X150 (Vascular Products) ×1 IMPLANT
SHUNT CAROTID BYPASS 10 (VASCULAR PRODUCTS) IMPLANT
SHUNT CAROTID BYPASS 12FRX15.5 (VASCULAR PRODUCTS) IMPLANT
SPECIMEN JAR SMALL (MISCELLANEOUS) ×2 IMPLANT
SPONGE GAUZE 2X2 8PLY STRL LF (GAUZE/BANDAGES/DRESSINGS) ×1 IMPLANT
SPONGE SURGIFOAM ABS GEL 100 (HEMOSTASIS) IMPLANT
SUT ETHILON 3 0 PS 1 (SUTURE) IMPLANT
SUT PROLENE 6 0 CC (SUTURE) ×3 IMPLANT
SUT PROLENE 7 0 BV 1 (SUTURE) IMPLANT
SUT VIC AB 3-0 SH 27 (SUTURE) ×4
SUT VIC AB 3-0 SH 27X BRD (SUTURE) ×1 IMPLANT
SUT VICRYL 4-0 PS2 18IN ABS (SUTURE) ×2 IMPLANT
SYR CONTROL 10ML LL (SYRINGE) IMPLANT
TAPE CLOTH SURG 4X10 WHT LF (GAUZE/BANDAGES/DRESSINGS) ×1 IMPLANT
TOWEL OR 17X24 6PK STRL BLUE (TOWEL DISPOSABLE) ×2 IMPLANT
TOWEL OR 17X26 10 PK STRL BLUE (TOWEL DISPOSABLE) ×2 IMPLANT
TRAY FOLEY CATH 14FRSI W/METER (CATHETERS) ×2 IMPLANT
WATER STERILE IRR 1000ML POUR (IV SOLUTION) ×2 IMPLANT

## 2011-10-21 NOTE — H&P (View-Only) (Signed)
History of Present Illness:  Patient is a 77 y.o. year old male who presents for evaluation of carotid stenosis.  Speech. He recovered after approximate 72 hours. He has had no prior stroke or TIA events prior to this. He denies history of atrial fibrillation. He denies history of chest pain. He denies history of amaurosis. His atherosclerotic risk factors include hypertension, elevated cholesterol, and remote tobacco history. These medical problems are currently followed by Dr. Juanetta Gosling and are stable. He is currently on antiplatelet therapy in the form of Plavix and aspirin. He was on aspirin alone prior to this event.  Past Medical History  Diagnosis Date  . Arthritis   . Dizziness     Past Surgical History  Procedure Date  . Cataract extraction      Social History History  Substance Use Topics  . Smoking status: Former Games developer  . Smokeless tobacco: Not on file  . Alcohol Use: No    Family History History reviewed. No pertinent family history.  Allergies  Allergies  Allergen Reactions  . Vicodin (Hydrocodone-Acetaminophen) Other (See Comments)    'too strong' causes me to 'fall out'     Current Outpatient Prescriptions  Medication Sig Dispense Refill  . Ascorbic Acid (VITAMIN C) 100 MG tablet Take 100 mg by mouth daily.        Marland Kitchen aspirin EC 81 MG tablet Take 81 mg by mouth daily.        . Cholecalciferol (VITAMIN D PO) Take 1 tablet by mouth daily. Unsure of strength      . clopidogrel (PLAVIX) 75 MG tablet Take 1 tablet (75 mg total) by mouth daily with breakfast.  30 tablet  12  . ramipril (ALTACE) 2.5 MG capsule Take 1 capsule (2.5 mg total) by mouth daily.  30 capsule  12  . rosuvastatin (CRESTOR) 40 MG tablet Take 1 tablet (40 mg total) by mouth daily at 6 PM.  40 tablet  12    ROS:   General:  No weight loss, Fever, chills  HEENT: No recent headaches, no nasal bleeding, no visual changes, no sore throat  Neurologic: No dizziness, blackouts, seizures. No recent  symptoms of stroke or mini- stroke. No recent episodes of slurred speech, or temporary blindness.  Cardiac: No recent episodes of chest pain/pressure, no shortness of breath at rest.  No shortness of breath with exertion.  Denies history of atrial fibrillation or irregular heartbeat  Vascular: No history of rest pain in feet.  No history of claudication.  No history of non-healing ulcer, No history of DVT   Pulmonary: No home oxygen, no productive cough, no hemoptysis,  No asthma or wheezing  Musculoskeletal:  [ ]  Arthritis, [ ]  Low back pain,  [ ]  Joint pain  Hematologic:No history of hypercoagulable state.  No history of easy bleeding.  No history of anemia  Gastrointestinal: No hematochezia or melena,  No gastroesophageal reflux, no trouble swallowing  Urinary: [ ]  chronic Kidney disease, [ ]  on HD - [ ]  MWF or [ ]  TTHS, [ ]  Burning with urination, [ ]  Frequent urination, [ ]  Difficulty urinating;   Skin: No rashes  Psychological: No history of anxiety,  No history of depression   Physical Examination  Filed Vitals:   10/16/11 0917 10/16/11 0918  BP: 130/70 150/70  Pulse: 88 87  Resp: 16   Height: 5\' 10"  (1.778 m)   Weight: 142 lb (64.411 kg)   SpO2: 98% 98%    Body mass index is  20.37 kg/(m^2).  General:  Alert and oriented, no acute distress HEENT: Normal Neck: No bruit or JVD Pulmonary: Clear to auscultation bilaterally Cardiac: Regular Rate and Rhythm without murmur Gastrointestinal: Soft, non-tender, non-distended, no mass, no scars Skin: No rash Extremity Pulses:  2+ radial, brachial, femoral, pulses bilaterally Musculoskeletal: No deformity or edema  Neurologic: Upper and lower extremity motor 5/5 and symmetric  DATA: He had a carotid duplex exam today which I reviewed and interpreted. This showed minimal less than 40% stenosis of the right internal carotid artery he had a 60-80% left internal carotid artery stenosis vertebral flow was antegrade  bilaterally   ASSESSMENT:  Symptomatic left internal carotid artery stenosis with prior stroke symptoms now resolved  PLAN: Left carotid endarterectomy on Tuesday, 10/21/2011  I discussed with the patient the risks of carotid endarterectomy including but not limited to myocardial events 5%, cranial nerve injury 10-15%, stroke 2-5%, bleeding or infection 1%  I also discussed the benefits of long term stroke prevention and the advantage compared to medical therapy  The patient is aware of the risks and agrees to proceed forward with the procedure.  Fabienne Bruns, MD [facility] [place] Office: [phone] Pager: [phone]

## 2011-10-21 NOTE — Progress Notes (Signed)
Dr. Noreene Larsson notified patient had 1 cup black coffee at 0330.

## 2011-10-21 NOTE — Interval H&P Note (Signed)
History and Physical Interval Note:  10/21/2011 7:41 AM  Clinton Chambers  has presented today for surgery, with the diagnosis of Left ICA Stenosis  The various methods of treatment have been discussed with the patient and family. After consideration of risks, benefits and other options for treatment, the patient has consented to  Procedure(s): ENDARTERECTOMY CAROTID as a surgical intervention .  The patients' history has been reviewed, patient examined, no change in status, stable for surgery.  I have reviewed the patients' chart and labs.  Questions were answered to the patient's satisfaction.     Keval Nam E

## 2011-10-21 NOTE — Progress Notes (Signed)
Small amount bruising present along incision present when Dr. Darrick Penna rounded.  Dr. Darrick Penna OK with looks of incision.

## 2011-10-21 NOTE — Preoperative (Signed)
Beta Blockers   Reason not to administer Beta Blockers:Not Applicable 

## 2011-10-21 NOTE — Progress Notes (Signed)
Pt awake and alert in PACU.  Some hoarseness.  Apparently was a little narcotized earlier that has now improved.  He is following commands.  Has some mild assymmetry of left lower lip but pt states he thinks he is at baseline.  Upper and lower extremity motor 5/5.  Left neck flat, JP minimal  Blood pressure 95/42, pulse 88, temperature 97.9 F (36.6 C), temperature source Oral, resp. rate 14, weight 138 lb (62.596 kg), SpO2 98.00%.   Stable post left CEA, possible mild left marginal mandibular nerve palsy vs edentulous mouth Cont to follow No evidence of stroke or hematoma  Fabienne Bruns, MD [facility] [place] Office: [phone] Pager: [phone]

## 2011-10-21 NOTE — Anesthesia Postprocedure Evaluation (Signed)
  Anesthesia Post-op Note  Patient: Clinton Chambers  Procedure(s) Performed:  ENDARTERECTOMY CAROTID - with patch angioplasty using Hemashield patch 0.8cm x 15.2cm  Patient Location: PACU  Anesthesia Type: General  Level of Consciousness: awake, alert , oriented and patient cooperative  Airway and Oxygen Therapy: Patient Spontanous Breathing and Patient connected to nasal cannula oxygen  Post-op Pain: none  Post-op Assessment: Post-op Vital signs reviewed, Patient's Cardiovascular Status Stable, Respiratory Function Stable, Patent Airway, No signs of Nausea or vomiting and Pain level controlled  Post-op Vital Signs: stable  Complications: No apparent anesthesia complications

## 2011-10-21 NOTE — Op Note (Signed)
Procedure Left carotid endarterectomy  Preoperative diagnosis: High-grade asymptomatic left internal carotid artery stenosis  Postoperative diagnosis: Same  Anesthesia General  Asst.: [name], PAC  Operative findings: #1 greater than 80% left internal carotid artery stenosis with calcified plaque, tandem stenosis in LCCA  #2 10 French shunt, long  #3 Dacron patch, approx 10 cm in length  Operative details: After obtaining informed consent, the patient was taken to the operating room. The patient was placed in a supine position on the operating room table. After induction of general anesthesia and endotracheal intubation a Foley catheter was placed. Next the patient's entire neck and chest was prepped and draped in usual sterile fashion. An oblique incision was made on the left aspect of the patient's neck anterior to the border the left sternocleidomastoid muscle. The incision was carried into the subcutaneous tissues and through the platysma. Sternocleidomastoid muscle was identified and reflected laterally. The omohyoid muscle was identified this was divided with cautery. Common carotid artery was then found at the base of the incision this was dissected free circumferentially. The vagus nerve was identified and protected. The common carotid artery was quite thickened just below the bifurcation and this required fairly proximal dissection below the omohyoid in order to find a soft segment of artery.  Dissection was then carried up to the level carotid bifurcation. The Ansa cervicalis was identified and traced up to its insertion into the hypoglossal nerve.  The hyperglossal nerve was identified and protected. The internal carotid artery was dissected free circumferentially just below the level of the hypoglossal nerve and it was soft in character at this location and above any palpable disease. A vessel loop was placed around this. Next the external carotid and superior thyroid arteries  were dissected free circumferentially and vessel loops were placed around these. The patient was given 6000 units of intravenous heparin. He was also given an additional 5000 units of heparin later in the case.  After 2 minutes of circulation time and raising the mean arterial pressure to 85 mm mercury, the distal internal carotid artery was controlled with small bulldog clamp. The external carotid and superior thyroid arteries were controlled with vessel loops. The common carotid artery was controlled peripheral DeBakey clamp. A longitudinal opening was made in the common carotid artery just below the bifurcation. The arteriotomy was extended distally up in the internal carotid with Potts scissors. There was a large calcified plaque with greater than 90% stenosis at the carotid bifurcation. The arteriotomy was extended passed the level of stenosis. The arteriotomy was extended proximally passed a very diseased segment of common carotid artery with at least 60% stenosis.  Next a 10 [place] shunt was brought in the operative field was threaded in the distal internal carotid artery and allowed to backbleed thoroughly. There was brisk backbleeding from the internal carotid artery around the shunt requiring a distal Javid shunt clamp.  This shunt was then threaded into the common carotid artery and secured with a Rummel tourniquet. Shunt was inspected and found to be free of air. Flow was then restored to the brain after approximately 10 minutes of ischemia time.  Attention was then turned to the common carotid artery once again. A suitable endarterectomy plane was obtained and endarterectomy was begun in the common carotid artery and a good proximal endpoint was obtained. An eversion endarterectomy was performed on the external carotid artery and a good endpoint was obtained. The plaque was then elevated in the internal carotid artery and a nice feathered  distal endpoint was also obtained. The plaque was passed off the  table as a specimen. All loose debris was then removed from the carotid bed and everything was thoroughly irrigated with heparinized saline. The Dacron patch was then brought out on to the operative field this was sewn on as a patch angioplasty using running 6-0 Prolene suture.  The patch was approximately 10 cm in length.   Prior to completion of the anastomosis the shunt was reoccluded and brought down out of the distal internal carotid artery. The internal carotid artery was thoroughly backbled. This was then controlled again with small bulldog clamp. The shunt was then removed from the proximal common carotid artery and this is again secured with a peripheral DeBakey clamp after forward bleeding. The external carotid was also thoroughly backbled.  The remainder of the patch was completed and the anastomosis was secured. Flow was then restored first retrograde from the external carotid into the carotid bed then antegrade from the common carotid to the external carotid artery and after approximately 5 cardiac cycles to the internal carotid artery. Doppler was used to evaluate the external/internal and common carotid arteries and these all had good Doppler flow. Hemostasis was obtained with direct pressure and thrombin and gelfoam.   The heparin was reversed with 110 mg Protamine.   Hemostasis was obtained.  A 10 flat Al Pimple drain was placed through a separate stab incision at the base of the neck and placed over the patch.  The drain was secured to the skin with a 3 0 Nylon suture.  The platysma muscle was reapproximated using a running 3-0 Vicryl suture. The skin was closed with 4 Vicryl subcuticular stitch.  Dermabond was applied to the skin.    The patient tolerated the procedure well except for blood pressure lability.  The patient was awakened in the operating room and was following commands and moving upper extremities symmetrically prior to transfer to the PACU.   Fabienne Bruns, MD [facility] [place] Office: [phone] Pager: [phone]

## 2011-10-21 NOTE — Anesthesia Preprocedure Evaluation (Signed)
Anesthesia Evaluation  Patient identified by MRN, date of birth, ID band Patient awake    Reviewed: Allergy & Precautions, H&P , NPO status , Patient's Chart, lab work & pertinent test results  Airway Mallampati: I      Dental  (+) Dental Advisory Given, Edentulous Upper and Edentulous Lower   Pulmonary  clear to auscultation        Cardiovascular hypertension, Regular     Neuro/Psych TIACVA    GI/Hepatic GERD-  ,  Endo/Other    Renal/GU      Musculoskeletal   Abdominal   Peds  Hematology   Anesthesia Other Findings   Reproductive/Obstetrics                           Anesthesia Physical Anesthesia Plan  ASA: III  Anesthesia Plan: General   Post-op Pain Management:    Induction: Intravenous  Airway Management Planned: Oral ETT  Additional Equipment: Arterial line  Intra-op Plan:   Post-operative Plan: Extubation in OR  Informed Consent: I have reviewed the patients History and Physical, chart, labs and discussed the procedure including the risks, benefits and alternatives for the proposed anesthesia with the patient or authorized representative who has indicated his/her understanding and acceptance.   Dental advisory given  Plan Discussed with: CRNA  Anesthesia Plan Comments:         Anesthesia Quick Evaluation

## 2011-10-21 NOTE — Anesthesia Postprocedure Evaluation (Signed)
  Anesthesia Post-op Note  Patient: Clinton Chambers  Procedure(s) Performed:  ENDARTERECTOMY CAROTID - with patch angioplasty using Hemashield patch 0.8cm x 15.2cm  Patient Location: PACU  Anesthesia Type: General  Level of Consciousness: awake  Airway and Oxygen Therapy: Patient Spontanous Breathing  Post-op Pain: mild  Post-op Assessment: Post-op Vital signs reviewed  Post-op Vital Signs: stable  Complications: No apparent anesthesia complicationsIn PACU at 1335, patient became apneic briefly after being given Dialudid and versed. Dr. Michelle Piper in PACU and immediately mask ventilated patient by AMBU bag then given 1/2 amp narcan-0.2mg . Patient immediately responded and was alert, oriented and talking immediately afterwards at 1340. CE

## 2011-10-21 NOTE — Transfer of Care (Signed)
Immediate Anesthesia Transfer of Care Note  Patient: Clinton Chambers  Procedure(s) Performed:  ENDARTERECTOMY CAROTID - with patch angioplasty using Hemashield patch 0.8cm x 15.2cm  Patient Location: PACU  Anesthesia Type: General  Level of Consciousness: awake, patient cooperative and responds to stimulation  Airway & Oxygen Therapy: Patient Spontanous Breathing and Patient connected to nasal cannula oxygen  Post-op Assessment: Report given to PACU RN, Post -op Vital signs reviewed and stable, Patient moving all extremities and Patient able to stick tongue midline  Post vital signs: Reviewed and stable  Complications: No apparent anesthesia complications

## 2011-10-22 LAB — BASIC METABOLIC PANEL
BUN: 18 mg/dL (ref 6–23)
Chloride: 105 mEq/L (ref 96–112)
Creatinine, Ser: 1.08 mg/dL (ref 0.50–1.35)
GFR calc Af Amer: 71 mL/min — ABNORMAL LOW (ref >90)
GFR calc non Af Amer: 61 mL/min — ABNORMAL LOW (ref >90)
Potassium: 3.6 mEq/L (ref 3.5–5.1)

## 2011-10-22 LAB — CBC
HCT: 26.9 % — ABNORMAL LOW (ref 39.0–52.0)
MCHC: 33.5 g/dL (ref 30.0–36.0)
Platelets: 142 10*3/uL — ABNORMAL LOW (ref 150–400)
RDW: 12.9 % (ref 11.5–15.5)
WBC: 6 10*3/uL (ref 4.0–10.5)

## 2011-10-22 MED ORDER — OXYCODONE HCL 5 MG PO TABS: 2.5000 mg | ORAL_TABLET | Freq: Four times a day (QID) | ORAL | Status: DC | PRN

## 2011-10-22 MED FILL — Naloxone HCl Inj 0.4 MG/ML: INTRAMUSCULAR | Qty: 1 | Status: AC

## 2011-10-22 NOTE — Discharge Summary (Addendum)
[facility] Discharge Summary  Clinton Chambers 06/09/1926 77 y.o. male  [id number]  Admission Date: 10/21/2011  Discharge Date: 10/23/11  Physician: Sherren Kerns, MD  Admission Diagnosis: Left ICA Stenosis   HPI:   This is a 77 y.o. male who presents for evaluation of carotid stenosis. Speech. He recovered after approximate 72 hours. He has had no prior stroke or TIA events prior to this. He denies history of atrial fibrillation. He denies history of chest pain. He denies history of amaurosis. His atherosclerotic risk factors include hypertension, elevated cholesterol, and remote tobacco history. These medical problems are currently followed by Dr. Juanetta Gosling and are stable. He is currently on antiplatelet therapy in the form of Plavix and aspirin. He was on aspirin alone prior to this event.    Hospital Course:  The patient was admitted to the hospital and taken to the operating room on 10/21/2011 and underwent Left carotid endarterectomy .  He also had a blake drain placed, which is removed on POD 1. The pt tolerated the procedure well and was transported to the PACU in good condition. By POD 1, his neuro exam is in tact, but somewhat disoriented d/t receiving pain meds.  He also had a couple of runs of VTach and was asymptomatic.  His foley was removed and he has voided.  He does have acute surgical blood loss anemia and is tolerating.  The remainder of the hospital course consisted of increasing ambulation and increasing intake of solids without difficulty.    Basename 10/22/11 0320 10/20/11 1516  NA 137 144  K 3.6 4.2  CL 105 103  CO2 27 30  GLUCOSE 109* 106*  BUN 18 26*  CALCIUM 7.9* 10.4    Basename 10/22/11 0320 10/20/11 1516  WBC 6.0 7.6  HGB 9.0* 13.2  HCT 26.9* 40.8  PLT 142* 231    Basename 10/20/11 1516  INR 1.01     Discharge Instructions:   The patient is discharged to home with extensive instructions on wound care and progressive  ambulation.  They are instructed not to drive or perform any heavy lifting until returning to see the physician in his office.  He will follow up with cardiology on an outpt basis.  Discharge Diagnosis:  Left ICA Stenosis  Secondary Diagnosis: Patient Active Problem List  Diagnoses  . TIA (transient ischemic attack)   Past Medical History  Diagnosis Date  . Dizziness   . Hypertension   . Stroke     end Nov. 2012  . GERD (gastroesophageal reflux disease)     sometimes /w heartburn  . Arthritis     legs     Clinton Chambers, Clinton Chambers  Home Medication Instructions [id number]   Printed on:10/22/11 0741  Medication Information                    aspirin EC 81 MG tablet Take 81 mg by mouth daily. Last dose 11/29 prior to procedure.           Cholecalciferol (VITAMIN D PO) Take 1 tablet by mouth daily. Unsure of strength           Ascorbic Acid (VITAMIN C) 100 MG tablet Take 100 mg by mouth daily.             ramipril (ALTACE) 2.5 MG capsule Take 1 capsule (2.5 mg total) by mouth daily.           rosuvastatin (CRESTOR) 40 MG tablet Take 1 tablet (  40 mg total) by mouth daily at 6 PM.           OVER THE COUNTER MEDICATION Place 1-2 sprays into the nose 2 (two) times daily as needed. Vicks Nasal Spray OTC. As needed for nasal congestion.            OVER THE COUNTER MEDICATION Place 1-2 drops into both eyes every morning. Thera Tears opthalmic solution OTC.            oxyCODONE (ROXICODONE) 5 MG immediate release tablet Take 0.5 tablets (2.5 mg total) by mouth every 6 (six) hours as needed for pain. #30 (thirty) NR            Disposition: home  Patient's condition: is Good The pt will NOT be sent home with Oxycodone.  He will take ibuprofen 200 mg po q4h prn pain.  [name], PA-C [facility] [phone] 10/22/2011  7:34 AM

## 2011-10-22 NOTE — Progress Notes (Signed)
Nursing note: late entry: Pt confused, will remain on 3300 per Dr order. Dr aware of 3 runs of V tach today. JP drain d/c per order, but removed from [facility] list in Epic.

## 2011-10-22 NOTE — Progress Notes (Addendum)
[facility] Progress Note  10/22/2011 7:19 AM POD 1  Subjective:  Somewhat disoriented this am since taking oxy IR this am.  Filed Vitals:   10/22/11 0500  BP: 135/21  Pulse: 102  Temp: 98.4 F (36.9 C)  Resp: 20     Physical Exam: Neuro:  In tact.  No deficits.  Incision:  C/d/i without drainage.  There is ecchymosis around the incision, but no hematoma.  CBC    Component Value Date/Time   WBC 6.0 10/22/2011 0320   RBC 3.08* 10/22/2011 0320   HGB 9.0* 10/22/2011 0320   HCT 26.9* 10/22/2011 0320   PLT 142* 10/22/2011 0320   MCV 87.3 10/22/2011 0320   MCH 29.2 10/22/2011 0320   MCHC 33.5 10/22/2011 0320   RDW 12.9 10/22/2011 0320   LYMPHSABS 1.6 10/20/2011 1516   MONOABS 1.0 10/20/2011 1516   EOSABS 0.5 10/20/2011 1516   BASOSABS 0.0 10/20/2011 1516    BMET    Component Value Date/Time   NA 137 10/22/2011 0320   K 3.6 10/22/2011 0320   CL 105 10/22/2011 0320   CO2 27 10/22/2011 0320   GLUCOSE 109* 10/22/2011 0320   BUN 18 10/22/2011 0320   CREATININE 1.08 10/22/2011 0320   CALCIUM 7.9* 10/22/2011 0320   GFRNONAA 61* 10/22/2011 0320   GFRAA 71* 10/22/2011 0320    JP DRAIN:  40 cc total   Assessment/Plan:  This is a 77 y.o. male who is s/p left CEA POD 1 -Neuro exam in tact this am.  He is somewhat disoriented from pain meds this am (He received 10mg  of oxy IR).  He will need a lesser dose to be discharged on. Will d/c on 2.5 mg po q6h prn pain.  -Foley discontinued this am and he has voided and ambulated this am.  -Acute surgical blood loss anemia.  Blood pressure tolerating.  -Will discontinue plavix and send home on aspirin only.  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 He has some muffling of his voice but swallow is intact.  This is either ET tube related or some vagus  trauma.  Had 2 short bursts of Vtach in the last hour aymptomatic  Will keep one more day due to above findings.  If additional VTach runs will consult cardiology.  No further narcotic pain meds.  D/c JP drain

## 2011-10-23 ENCOUNTER — Encounter (HOSPITAL_COMMUNITY): Payer: Self-pay | Admitting: Vascular Surgery

## 2011-10-23 MED ORDER — FAMOTIDINE 20 MG PO TABS
20.0000 mg | ORAL_TABLET | Freq: Two times a day (BID) | ORAL | Status: DC
  Filled 2011-10-23: qty 1

## 2011-10-23 MED ORDER — IBUPROFEN 200 MG PO TABS: 200.0000 mg | ORAL_TABLET | Freq: Four times a day (QID) | ORAL | Status: AC | PRN

## 2011-10-23 MED ORDER — IBUPROFEN 200 MG PO TABS
200.0000 mg | ORAL_TABLET | Freq: Four times a day (QID) | ORAL | Status: DC | PRN
Start: 2011-10-23 — End: 2011-10-23
  Administered 2011-10-23: 200 mg via ORAL
  Filled 2011-10-23 (×2): qty 1

## 2011-10-23 NOTE — Progress Notes (Addendum)
[facility] Progress Note  10/23/2011 7:09 AM POD 2  Awake and alert.  Not oriented to year.  Tmax 100.9 now afebrile  100% 2L O2 [place]   Filed Vitals:   10/23/11 0426  BP: 125/98  Pulse: 93  Temp: 98.8 F (37.1 C)  Resp: 24     Physical Exam: Neuro:  Awake, oriented to place, but not year.  Otherwise, neuro exam in tact.  He sill has a possible mild mandibular nerve palsy. Incision:  C/d/i.  Ecchymosis around incision, but no hematoma. Cardiac:  Had a burst of VTach yesterday around 4pm, but none since.  CBC    Component Value Date/Time   WBC 6.0 10/22/2011 0320   RBC 3.08* 10/22/2011 0320   HGB 9.0* 10/22/2011 0320   HCT 26.9* 10/22/2011 0320   PLT 142* 10/22/2011 0320   MCV 87.3 10/22/2011 0320   MCH 29.2 10/22/2011 0320   MCHC 33.5 10/22/2011 0320   RDW 12.9 10/22/2011 0320   LYMPHSABS 1.6 10/20/2011 1516   MONOABS 1.0 10/20/2011 1516   EOSABS 0.5 10/20/2011 1516   BASOSABS 0.0 10/20/2011 1516    BMET    Component Value Date/Time   NA 137 10/22/2011 0320   K 3.6 10/22/2011 0320   CL 105 10/22/2011 0320   CO2 27 10/22/2011 0320   GLUCOSE 109* 10/22/2011 0320   BUN 18 10/22/2011 0320   CREATININE 1.08 10/22/2011 0320   CALCIUM 7.9* 10/22/2011 0320   GFRNONAA 61* 10/22/2011 0320   GFRAA 71* 10/22/2011 0320     Assessment/Plan:  This is a 76 y.o. male who is s/p left CEA POD 2 -Doing better this am.    -Wants meds for pain.  States when he has had tylenol in the past it made him pass out.  Will give him ibuprofen 200 mg po q4h prn pain.  -Short run VTach yesterday afternoon (as well as 2 runs yesterday morning).  May need cardiology consult before discharge.  Clinton Pigg, PA-C [facility] [phone]  Overall looks well.  No Hematoma, Neuro intact. No further PVCs Will d/c home today Outpt cardiology follow up.  Clinton Bruns, MD [facility] [place] Office: [phone] Pager: [phone]

## 2011-10-23 NOTE — Addendum Note (Signed)
Addendum  created 10/23/11 1701 by Judie Petit, MD   Modules edited:Notes Section

## 2011-10-23 NOTE — Progress Notes (Signed)
Discharge instructions given per Julien Girt, RN, BSN to pt and son @ bedside. Pt dc'd to front entrance via WC. BCates, Charity fundraiser, BSN.

## 2011-11-05 ENCOUNTER — Encounter: Payer: Self-pay | Admitting: Vascular Surgery

## 2011-11-06 ENCOUNTER — Ambulatory Visit (INDEPENDENT_AMBULATORY_CARE_PROVIDER_SITE_OTHER): Payer: Medicare Other | Admitting: Vascular Surgery

## 2011-11-06 ENCOUNTER — Ambulatory Visit (INDEPENDENT_AMBULATORY_CARE_PROVIDER_SITE_OTHER): Payer: Medicare Other | Admitting: Cardiovascular Disease

## 2011-11-06 ENCOUNTER — Encounter: Payer: Self-pay | Admitting: Cardiovascular Disease

## 2011-11-06 ENCOUNTER — Encounter: Payer: Self-pay | Admitting: Vascular Surgery

## 2011-11-06 VITALS — BP 173/63 | HR 84 | Temp 97.7°F | Ht 71.0 in | Wt 139.0 lb

## 2011-11-06 DIAGNOSIS — I6529 Occlusion and stenosis of unspecified carotid artery: Secondary | ICD-10-CM

## 2011-11-06 DIAGNOSIS — Z9889 Other specified postprocedural states: Secondary | ICD-10-CM

## 2011-11-06 DIAGNOSIS — I499 Cardiac arrhythmia, unspecified: Secondary | ICD-10-CM | POA: Insufficient documentation

## 2011-11-06 DIAGNOSIS — E782 Mixed hyperlipidemia: Secondary | ICD-10-CM

## 2011-11-06 DIAGNOSIS — G459 Transient cerebral ischemic attack, unspecified: Secondary | ICD-10-CM

## 2011-11-06 NOTE — Progress Notes (Signed)
Patient ID: Clinton Chambers, male   DOB: 06/26/1926, 77 y.o.   MRN: [id number] 77 yo referred by Dr Juanetta Gosling for ? PVC  No documentation.  He is asymptomatic.  Had CEA in December by Dr Darrick Penna.  Had TIA with right sided weakness and had moderate left sided blockage ? With plaque.  Hospitalizaiton uneventlful.  Tele strip with PAC.  He has no history of CAD.  No history of murmur.  No history of CHF.  ECG from 11/12 showed LVH with PAC no ischemic changes or old mi.  On ASA and cholesterol med.  Has F/U with Dr Darrick Penna today.  No palpitatons, or syncope.    ROS: Denies fever, malais, weight loss, blurry vision, decreased visual acuity, cough, sputum, SOB, hemoptysis, pleuritic pain, palpitaitons, heartburn, abdominal pain, melena, lower extremity edema, claudication, or rash.  All other systems reviewed and negative   General: Affect appropriate Healthy:  appears stated age HEENT: normal Neck supple with no adenopathy JVP normal no bruits no thyromegaly Lungs clear with no wheezing and good diaphragmatic motion Heart:  S1/S2 no murmur,rub, gallop or click PMI normal Abdomen: benighn, BS positve, no tenderness, no AAA no bruit.  No HSM or HJR Distal pulses intact with no bruits No edema Neuro non-focal Skin warm and dry No muscular weakness  Medications Current Outpatient Prescriptions  Medication Sig Dispense Refill  . Ascorbic Acid (VITAMIN C) 100 MG tablet Take 100 mg by mouth daily.        Marland Kitchen aspirin EC 81 MG tablet Take 81 mg by mouth daily. Last dose 11/29 prior to procedure.      . Cholecalciferol (VITAMIN D PO) Take 1 tablet by mouth daily. Unsure of strength      . OVER THE COUNTER MEDICATION Place 1-2 sprays into the nose 2 (two) times daily as needed. Vicks Nasal Spray OTC. As needed for nasal congestion.       Marland Kitchen OVER THE COUNTER MEDICATION Place 1-2 drops into both eyes every morning. Thera Tears opthalmic solution OTC.       . ramipril (ALTACE) 2.5 MG capsule Take 1 capsule (2.5  mg total) by mouth daily.  30 capsule  12  . rosuvastatin (CRESTOR) 40 MG tablet Take 1 tablet (40 mg total) by mouth daily at 6 PM.  40 tablet  12    Allergies Vicodin  Family History: Family History  Problem Relation Age of Onset  . Anesthesia problems Neg Hx   . Hypotension Neg Hx   . Malignant hyperthermia Neg Hx   . Pseudochol deficiency Neg Hx     Social History: History   Social History  . Marital Status: Divorced    Spouse Name: N/A    Number of Children: N/A  . Years of Education: N/A   Occupational History  . Not on file.   Social History Main Topics  . Smoking status: Former Smoker    Quit date: 10/19/1993  . Smokeless tobacco: Not on file  . Alcohol Use: No  . Drug Use: No  . Sexually Active:    Other Topics Concern  . Not on file   Social History Narrative  . No narrative on file    Electrocardiogram:  11/12  NSR PAC LVH  Assessment and Plan

## 2011-11-06 NOTE — Progress Notes (Signed)
Addended by: Sharee Pimple on: 11/06/2011 03:29 PM   Modules accepted: Orders

## 2011-11-06 NOTE — Progress Notes (Signed)
[facility] [place] HISTORY AND PHYSICAL    History of Present Illness:  Patient is a 77 y.o. year old male who presents for post-operative follow-up after left carotid endarterectomy on 10/21/11.  Denies headaches, numbness, tingling or other neuro deficits.  No swallowing problems.  No incisional drainage   Physical Examination  Blood pressure 173/63, pulse 84, temperature 97.7 F (36.5 C), temperature source Oral, height 5\' 11"  (1.803 m), weight 139 lb (63.05 kg), SpO2 98.00%.  General:  Alert and oriented, no acute distress Neck: No bruit or JVD, neck incision healing left side Skin: No rash Neurologic: Upper and lower extremity motor 5/5 and symmetric   ASSESSMENT: Doing well post left CEA   PLAN:Continue ASA qd Carotid duplex 6 months  Fabienne Bruns, MD [facility] [place] Office: [phone] Pager: [phone]     Fabienne Bruns, MD [facility] [place] Office: [phone] Pager: [phone]

## 2011-11-06 NOTE — Assessment & Plan Note (Signed)
S/P CEA.  Continue ASA  F/U duplex 6 months Fields

## 2011-11-06 NOTE — Assessment & Plan Note (Signed)
Spoke with Dr Darrick Penna.  Apparently he had NSVT post op that was asymptomatic.  He is asymptomatic with normal ECG and exam.  No need for further w/u.

## 2011-11-06 NOTE — Patient Instructions (Signed)
Your physician recommends that you schedule a follow-up appointment in: AS NEEDED  Your physician recommends that you continue on your current medications as directed. Please refer to the Current Medication list given to you today.  

## 2011-11-06 NOTE — Assessment & Plan Note (Signed)
Cholesterol is at goal.  Continue current dose of statin and diet Rx.  No myalgias or side effects.  F/U  LFT's in 6 months. No results found for this basename: LDLCALC             

## 2012-01-01 ENCOUNTER — Emergency Department (HOSPITAL_COMMUNITY): Payer: Medicare Other

## 2012-01-01 ENCOUNTER — Emergency Department (HOSPITAL_COMMUNITY)
Admission: EM | Admit: 2012-01-01 | Discharge: 2012-01-02 | Disposition: A | Discharge status: Home / Self Care | Payer: Medicare Other | Attending: Emergency Medicine | Admitting: Emergency Medicine

## 2012-01-01 ENCOUNTER — Other Ambulatory Visit: Payer: Self-pay

## 2012-01-01 ENCOUNTER — Encounter (HOSPITAL_COMMUNITY): Payer: Self-pay | Admitting: Emergency Medicine

## 2012-01-01 DIAGNOSIS — Z87891 Personal history of nicotine dependence: Secondary | ICD-10-CM | POA: Insufficient documentation

## 2012-01-01 DIAGNOSIS — Z8673 Personal history of transient ischemic attack (TIA), and cerebral infarction without residual deficits: Secondary | ICD-10-CM | POA: Insufficient documentation

## 2012-01-01 DIAGNOSIS — I10 Essential (primary) hypertension: Secondary | ICD-10-CM | POA: Insufficient documentation

## 2012-01-01 DIAGNOSIS — R5381 Other malaise: Secondary | ICD-10-CM | POA: Insufficient documentation

## 2012-01-01 DIAGNOSIS — I498 Other specified cardiac arrhythmias: Secondary | ICD-10-CM | POA: Insufficient documentation

## 2012-01-01 DIAGNOSIS — K219 Gastro-esophageal reflux disease without esophagitis: Secondary | ICD-10-CM | POA: Insufficient documentation

## 2012-01-01 DIAGNOSIS — I491 Atrial premature depolarization: Secondary | ICD-10-CM | POA: Insufficient documentation

## 2012-01-01 DIAGNOSIS — R531 Weakness: Secondary | ICD-10-CM

## 2012-01-01 DIAGNOSIS — Z9849 Cataract extraction status, unspecified eye: Secondary | ICD-10-CM | POA: Insufficient documentation

## 2012-01-01 DIAGNOSIS — Z7982 Long term (current) use of aspirin: Secondary | ICD-10-CM | POA: Insufficient documentation

## 2012-01-01 MED ORDER — SODIUM CHLORIDE 0.9 % IV SOLN
INTRAVENOUS | Status: DC
  Administered 2012-01-01: via INTRAVENOUS

## 2012-01-01 NOTE — ED Notes (Signed)
Patient complaining of fever and high blood pressure starting yesteday. Denies nausea, vomiting, diarrhea. Denies pain at triage.

## 2012-01-01 NOTE — ED Provider Notes (Addendum)
History     CSN: [id number]  Arrival date & time 01/01/12  2241   First MD Initiated Contact with Patient 01/01/12 2308      Chief Complaint  Patient presents with  . Weakness    (Consider location/radiation/quality/duration/timing/severity/associated sxs/prior treatment) HPI This is an 78 year old white male with a two-day history of generalized weakness. He has had chills and subjective fever. He has had difficulty sleeping, frequent urination and malaise. He denies nausea, vomiting, diarrhea, shortness of breath, chest pain, abdominal pain or focal neurologic deficit. He has been coughing. The symptoms are moderate to severe. There does not appear to be any mitigating or exacerbating factors.   Past Medical History  Diagnosis Date  . Dizziness   . Hypertension   . Stroke     end Nov. 2012  . GERD (gastroesophageal reflux disease)     sometimes /w heartburn  . Arthritis     legs    Past Surgical History  Procedure Date  . Cataract extraction     /w IOL  . Endarterectomy 10/21/2011    Procedure: ENDARTERECTOMY CAROTID;  Surgeon: Sherren Kerns, MD;  Location: [facility] OR;  Service: Vascular;  Laterality: Left;  with patch angioplasty using Hemashield patch 0.8cm x 15.2cm    Family History  Problem Relation Age of Onset  . Anesthesia problems Neg Hx   . Hypotension Neg Hx   . Malignant hyperthermia Neg Hx   . Pseudochol deficiency Neg Hx     History  Substance Use Topics  . Smoking status: Former Smoker    Quit date: 10/19/1993  . Smokeless tobacco: Not on file  . Alcohol Use: No      Review of Systems  All other systems reviewed and are negative.    Allergies  Vicodin  Home Medications   Current Outpatient Rx  Name Route Sig Dispense Refill  . VITAMIN C 100 MG PO TABS Oral Take 100 mg by mouth daily.      . ASPIRIN EC 81 MG PO TBEC Oral Take 81 mg by mouth daily. Last dose 11/29 prior to procedure.    . ATORVASTATIN CALCIUM 80 MG PO TABS Oral Take 80  mg by mouth daily.    Marland Kitchen VITAMIN D PO Oral Take 1 tablet by mouth daily. Unsure of strength    . OVER THE COUNTER MEDICATION Nasal Place 1-2 sprays into the nose 2 (two) times daily as needed. Vicks Nasal Spray OTC. As needed for nasal congestion.     Marland Kitchen OVER THE COUNTER MEDICATION Both Eyes Place 1-2 drops into both eyes every morning. Thera Tears opthalmic solution OTC.     Marland Kitchen RAMIPRIL 2.5 MG PO CAPS Oral Take 1 capsule (2.5 mg total) by mouth daily. 30 capsule 12  . ROSUVASTATIN CALCIUM 40 MG PO TABS Oral Take 1 tablet (40 mg total) by mouth daily at 6 PM. 40 tablet 12    BP 169/90  Pulse 119  Temp(Src) 98.2 F (36.8 C) (Oral)  Resp 18  Ht 5\' 11"  (1.803 m)  Wt 139 lb (63.05 kg)  BMI 19.39 kg/m2  SpO2 98%  Physical Exam General: Well-developed, well-nourished male in no acute distress; appearance consistent with age of record HENT: normocephalic, atraumatic; edentulous Eyes: pupils equal round and reactive to light; extraocular muscles intact Neck: supple Heart: regular rate and rhythm; distant sounds; tachycardia Lungs: clear to auscultation bilaterally; distant sounds Abdomen: soft; nondistended; nontender; no masses or hepatosplenomegaly; bowel sounds present Extremities: No deformity; full range of  motion; pulses normal Neurologic: Awake, alert and oriented; motor function intact in all extremities and symmetric; no facial droop Skin: Warm and dry    ED Course  Procedures (including critical care time)     MDM  EKG Interpretation:  Date & Time: 01/01/2012 10:49 PM  Rate: 119  Rhythm: sinus tachycardia and premature atrial contractions (PAC)  QRS Axis: left  Intervals: normal  ST/T Wave abnormalities: nonspecific ST/T changes  Conduction Disutrbances:none  Narrative Interpretation:   Old EKG Reviewed: none available  Nursing notes and vitals signs, including pulse oximetry, reviewed.  Summary of this visit's results, reviewed by myself:  Labs:  Results for  orders placed during the hospital encounter of 01/01/12  URINALYSIS, ROUTINE W REFLEX MICROSCOPIC      Component Value Range   Color, Urine YELLOW  YELLOW    APPearance CLEAR  CLEAR    Specific Gravity, Urine 1.020  1.005 - 1.030    pH 7.5  5.0 - 8.0    Glucose, UA NEGATIVE  NEGATIVE (mg/dL)   Hgb urine dipstick NEGATIVE  NEGATIVE    Bilirubin Urine NEGATIVE  NEGATIVE    Ketones, ur 15 (*) NEGATIVE (mg/dL)   Protein, ur NEGATIVE  NEGATIVE (mg/dL)   Urobilinogen, UA 0.2  0.0 - 1.0 (mg/dL)   Nitrite NEGATIVE  NEGATIVE    Leukocytes, UA NEGATIVE  NEGATIVE   CBC      Component Value Range   WBC 8.5  4.0 - 10.5 (K/uL)   RBC 4.42  4.22 - 5.81 (MIL/uL)   Hemoglobin 12.9 (*) 13.0 - 17.0 (g/dL)   HCT 09.6 (*) 04.5 - 52.0 (%)   MCV 86.2  78.0 - 100.0 (fL)   MCH 29.2  26.0 - 34.0 (pg)   MCHC 33.9  30.0 - 36.0 (g/dL)   RDW 40.9  81.1 - 91.4 (%)   Platelets 185  150 - 400 (K/uL)  DIFFERENTIAL      Component Value Range   Neutrophils Relative 83 (*) 43 - 77 (%)   Neutro Abs 7.1  1.7 - 7.7 (K/uL)   Lymphocytes Relative 6 (*) 12 - 46 (%)   Lymphs Abs 0.5 (*) 0.7 - 4.0 (K/uL)   Monocytes Relative 10  3 - 12 (%)   Monocytes Absolute 0.9  0.1 - 1.0 (K/uL)   Eosinophils Relative 1  0 - 5 (%)   Eosinophils Absolute 0.0  0.0 - 0.7 (K/uL)   Basophils Relative 0  0 - 1 (%)   Basophils Absolute 0.0  0.0 - 0.1 (K/uL)  BASIC METABOLIC PANEL      Component Value Range   Sodium 135  135 - 145 (mEq/L)   Potassium 4.7  3.5 - 5.1 (mEq/L)   Chloride 101  96 - 112 (mEq/L)   CO2 26  19 - 32 (mEq/L)   Glucose, Bld 121 (*) 70 - 99 (mg/dL)   BUN 18  6 - 23 (mg/dL)   Creatinine, Ser 7.82  0.50 - 1.35 (mg/dL)   Calcium 9.8  8.4 - 95.6 (mg/dL)   GFR calc non Af Amer 67 (*) >90 (mL/min)   GFR calc Af Amer 78 (*) >90 (mL/min)  TROPONIN I      Component Value Range   Troponin I <0.30  <0.30 (ng/mL)    Imaging Studies: Dg Chest 2 View  01/01/2012  *RADIOLOGY REPORT*  Clinical Data: Fever and weakness for  1 day.  CHEST - 2 VIEW  Comparison: 10/20/2011  Findings: Normal heart size  and pulmonary vascularity.  Calcified and tortuous aorta.  Emphysematous changes in the lungs.  Apical scarring.  Calcified granulomas in the right apex.  Right apical opacity demonstrated previously is not as well visualized today. No focal airspace consolidation in the lungs.  No blunting of costophrenic angles.  No pneumothorax.  IMPRESSION: Emphysematous changes and postinflammatory granulomas and scarring in the lung apices.  No evidence of active pulmonary disease.  Original Report Authenticated By: Marlon Pel, M.D.    1:32 AM Suspect viral illness. Patient states he feels good at this time and wishes to go home.              Hanley Seamen, MD 01/02/12 0132  Carlisle Beers Alois Mincer, MD 01/02/12 6045

## 2012-01-02 LAB — URINALYSIS, ROUTINE W REFLEX MICROSCOPIC
Glucose, UA: NEGATIVE mg/dL
Leukocytes, UA: NEGATIVE
Specific Gravity, Urine: 1.02 (ref 1.005–1.030)
pH: 7.5 (ref 5.0–8.0)

## 2012-01-02 LAB — DIFFERENTIAL
Basophils Absolute: 0 10*3/uL (ref 0.0–0.1)
Lymphocytes Relative: 6 % — ABNORMAL LOW (ref 12–46)
Monocytes Absolute: 0.9 10*3/uL (ref 0.1–1.0)
Monocytes Relative: 10 % (ref 3–12)
Neutro Abs: 7.1 10*3/uL (ref 1.7–7.7)
Neutrophils Relative %: 83 % — ABNORMAL HIGH (ref 43–77)

## 2012-01-02 LAB — BASIC METABOLIC PANEL
CO2: 26 mEq/L (ref 19–32)
Chloride: 101 mEq/L (ref 96–112)
Creatinine, Ser: 1 mg/dL (ref 0.50–1.35)
Potassium: 4.7 mEq/L (ref 3.5–5.1)

## 2012-01-02 LAB — CBC
HCT: 38.1 % — ABNORMAL LOW (ref 39.0–52.0)
Hemoglobin: 12.9 g/dL — ABNORMAL LOW (ref 13.0–17.0)
WBC: 8.5 10*3/uL (ref 4.0–10.5)

## 2012-01-02 LAB — TROPONIN I: Troponin I: <0.3 ng/mL (ref <0.30)

## 2012-01-02 NOTE — ED Notes (Signed)
Patient complaining of pain at IV site. Noteable swelling at IV site. IV removed due to infiltration of normal saline.

## 2012-01-02 NOTE — ED Notes (Signed)
Assisted patient with urinal. Patient voided with no difficulty.

## 2012-05-05 ENCOUNTER — Encounter: Payer: Self-pay | Admitting: Neurosurgery

## 2012-05-06 ENCOUNTER — Other Ambulatory Visit (INDEPENDENT_AMBULATORY_CARE_PROVIDER_SITE_OTHER): Payer: Medicare PPO | Admitting: *Deleted

## 2012-05-06 ENCOUNTER — Encounter: Payer: Self-pay | Admitting: Neurosurgery

## 2012-05-06 ENCOUNTER — Ambulatory Visit (INDEPENDENT_AMBULATORY_CARE_PROVIDER_SITE_OTHER): Payer: Medicare PPO | Admitting: Neurosurgery

## 2012-05-06 VITALS — BP 140/70 | HR 62 | Resp 16 | Ht 71.0 in | Wt 134.5 lb

## 2012-05-06 DIAGNOSIS — Z9889 Other specified postprocedural states: Secondary | ICD-10-CM

## 2012-05-06 DIAGNOSIS — I6529 Occlusion and stenosis of unspecified carotid artery: Secondary | ICD-10-CM

## 2012-05-06 NOTE — Progress Notes (Signed)
[facility] [place] Carotid Office Note  CC: Six-month carotid duplex status post left CEA Referring Physician: Fields  History of Present Illness: 78 year old male patient of Dr. Darrick Penna status post left CEA December 2012. The patient is doing well, he denies any signs or symptoms of CVA, TIA, amaurosis fugax or any neural deficit. The patient denies any new medical diagnoses or recent surgeries.  Past Medical History  Diagnosis Date  . Dizziness   . Hypertension   . Stroke     end Nov. 2012  . GERD (gastroesophageal reflux disease)     sometimes /w heartburn  . Arthritis     legs    ROS: [x]  Positive   [ ]  Denies    General: [ ]  Weight loss, [ ]  Fever, [ ]  chills Neurologic: [ ]  Dizziness, [ ]  Blackouts, [ ]  Seizure [ ]  Stroke, [ ]  "Mini stroke", [ ]  Slurred speech, [ ]  Temporary blindness; [ ]  weakness in arms or legs, [ ]  Hoarseness Cardiac: [ ]  Chest pain/pressure, [ ]  Shortness of breath at rest [ ]  Shortness of breath with exertion, [ ]  Atrial fibrillation or irregular heartbeat Vascular: [ ]  Pain in legs with walking, [ ]  Pain in legs at rest, [ ]  Pain in legs at night,  [ ]  Non-healing ulcer, [ ]  Blood clot in vein/DVT,   Pulmonary: [ ]  Home oxygen, [ ]  Productive cough, [ ]  Coughing up blood, [ ]  Asthma,  [ ]  Wheezing Musculoskeletal:  [ ]  Arthritis, [ ]  Low back pain, [ ]  Joint pain Hematologic: [ ]  Easy Bruising, [ ]  Anemia; [ ]  Hepatitis Gastrointestinal: [ ]  Blood in stool, [ ]  Gastroesophageal Reflux/heartburn, [ ]  Trouble swallowing Urinary: [ ]  chronic Kidney disease, [ ]  on HD - [ ]  MWF or [ ]  TTHS, [ ]  Burning with urination, [ ]  Difficulty urinating Skin: [ ]  Rashes, [ ]  Wounds Psychological: [ ]  Anxiety, [ ]  Depression   Social History History  Substance Use Topics  . Smoking status: Former Smoker    Quit date: 10/19/1993  . Smokeless tobacco: Not on file  . Alcohol Use: No    Family History Family History  Problem Relation  Age of Onset  . Anesthesia problems Neg Hx   . Hypotension Neg Hx   . Malignant hyperthermia Neg Hx   . Pseudochol deficiency Neg Hx     Allergies  Allergen Reactions  . Vicodin (Hydrocodone-Acetaminophen) Other (See Comments)    'too strong' causes me to 'fall out'    Current Outpatient Prescriptions  Medication Sig Dispense Refill  . Ascorbic Acid (VITAMIN C) 100 MG tablet Take 100 mg by mouth daily.        Marland Kitchen aspirin EC 81 MG tablet Take 81 mg by mouth daily. Last dose 11/29 prior to procedure.      Marland Kitchen atorvastatin (LIPITOR) 80 MG tablet Take 80 mg by mouth daily.      . Cholecalciferol (VITAMIN D PO) Take 1 tablet by mouth daily. Unsure of strength      . OVER THE COUNTER MEDICATION Place 1-2 sprays into the nose 2 (two) times daily as needed. Vicks Nasal Spray OTC. As needed for nasal congestion.       Marland Kitchen OVER THE COUNTER MEDICATION Place 1-2 drops into both eyes every morning. Thera Tears opthalmic solution OTC.       . ramipril (ALTACE) 2.5 MG capsule Take 1 capsule (2.5 mg total) by mouth daily.  30 capsule  12  . rosuvastatin (CRESTOR) 40 MG tablet Take 1 tablet (40 mg total) by mouth daily at 6 PM.  40 tablet  12    Physical Examination  Filed Vitals:   05/06/12 0957  BP: 140/70  Pulse: 62  Resp:     Body mass index is 18.76 kg/(m^2).  General:  WDWN in NAD Gait: Normal HEENT: WNL Eyes: Pupils equal Pulmonary: normal non-labored breathing , without Rales, rhonchi,  wheezing Cardiac: RRR, without  Murmurs, rubs or gallops; Abdomen: soft, NT, no masses Skin: no rashes, ulcers noted  Vascular Exam Pulses: 3+ radial pulses bilaterally Carotid bruits: Carotid pulses to auscultation no bruits are heard Extremities without ischemic changes, no Gangrene , no cellulitis; no open wounds;  Musculoskeletal: no muscle wasting or atrophy   Neurologic: A&O X 3; Appropriate Affect ; SENSATION: normal; MOTOR FUNCTION:  moving all extremities equally. Speech is  fluent/normal  Non-Invasive Vascular Imaging CAROTID DUPLEX 05/06/2012  Right ICA 0 - 19% stenosis Left ICA 20 - 39 % stenosis   ASSESSMENT/PLAN: Asymptomatic patient status post left CEA. The patient return in 6 months for repeat carotid duplex. His questions were encouraged and answered, he is in agreement with this.  Lauree Chandler ANP   Clinic MD: Darrick Penna

## 2012-05-14 NOTE — Procedures (Unsigned)
CAROTID DUPLEX EXAM  INDICATION:  Carotid disease.  HISTORY: Diabetes:  No. Cardiac:  No. Hypertension:  No. Smoking:  Previous. Previous Surgery:  Left carotid endarterectomy on 10/21/2011. CV History:  CVA in November 2012, currently asymptomatic. Amaurosis Fugax No, Paresthesias No, Hemiparesis No                                      RIGHT             LEFT Brachial systolic pressure:         148               158 Brachial Doppler waveforms:         Normal            Normal Vertebral direction of flow:        Antegrade         Antegrade DUPLEX VELOCITIES (cm/sec) CCA peak systolic                   57                54 ECA peak systolic                   33                34 ICA peak systolic                   48                107 ICA end diastolic                   16                35 PLAQUE MORPHOLOGY:                  Heterogeneous PLAQUE AMOUNT:                      None              None PLAQUE LOCATION:                    ICA  IMPRESSION: 1. Patent left carotid endarterectomy site with no hemodynamically     significant stenosis noted in the bilateral internal carotid     arteries. 2. Decrease in velocities of the left internal carotid artery noted     when compared to the previous exam on 10/14/2011, with the right     RCA velocities remaining stable.  ___________________________________________ Janetta Hora. Fields, MD  CH/MEDQ  D:  05/10/2012  T:  05/10/2012  Job:  [id number]

## 2012-11-04 ENCOUNTER — Encounter: Payer: Self-pay | Admitting: Neurosurgery

## 2012-11-05 ENCOUNTER — Other Ambulatory Visit (INDEPENDENT_AMBULATORY_CARE_PROVIDER_SITE_OTHER): Payer: Medicare PPO | Admitting: *Deleted

## 2012-11-05 ENCOUNTER — Encounter: Payer: Self-pay | Admitting: Neurosurgery

## 2012-11-05 ENCOUNTER — Ambulatory Visit (INDEPENDENT_AMBULATORY_CARE_PROVIDER_SITE_OTHER): Payer: Medicare PPO | Admitting: Neurosurgery

## 2012-11-05 VITALS — BP 158/83 | HR 87 | Resp 16 | Ht 71.0 in | Wt 134.0 lb

## 2012-11-05 DIAGNOSIS — I6529 Occlusion and stenosis of unspecified carotid artery: Secondary | ICD-10-CM

## 2012-11-05 DIAGNOSIS — Z48812 Encounter for surgical aftercare following surgery on the circulatory system: Secondary | ICD-10-CM

## 2012-11-05 NOTE — Progress Notes (Signed)
[facility] [place] Carotid Office Note  CC: Carotid surveillance Referring Physician: Fields  History of Present Illness: 78 year old male patient of Dr. Darrick Penna status post left CEA in December of 2012. The patient denies any signs or symptoms of CVA, TIA, amaurosis fugax or any neural deficit. The patient denies any new medical diagnoses or recent surgery.  Past Medical History  Diagnosis Date  . Dizziness   . Hypertension   . Stroke     end Nov. 2012  . GERD (gastroesophageal reflux disease)     sometimes /w heartburn  . Arthritis     legs    ROS: [x]  Positive   [ ]  Denies    General: [ ]  Weight loss, [ ]  Fever, [ ]  chills Neurologic: [ ]  Dizziness, [ ]  Blackouts, [ ]  Seizure [ ]  Stroke, [ ]  "Mini stroke", [ ]  Slurred speech, [ ]  Temporary blindness; [ ]  weakness in arms or legs, [ ]  Hoarseness Cardiac: [ ]  Chest pain/pressure, [ ]  Shortness of breath at rest [ ]  Shortness of breath with exertion, [ ]  Atrial fibrillation or irregular heartbeat Vascular: [ ]  Pain in legs with walking, [ ]  Pain in legs at rest, [ ]  Pain in legs at night,  [ ]  Non-healing ulcer, [ ]  Blood clot in vein/DVT,   Pulmonary: [ ]  Home oxygen, [ ]  Productive cough, [ ]  Coughing up blood, [ ]  Asthma,  [ ]  Wheezing Musculoskeletal:  [ ]  Arthritis, [ ]  Low back pain, [ ]  Joint pain Hematologic: [ ]  Easy Bruising, [ ]  Anemia; [ ]  Hepatitis Gastrointestinal: [ ]  Blood in stool, [ ]  Gastroesophageal Reflux/heartburn, [ ]  Trouble swallowing Urinary: [ ]  chronic Kidney disease, [ ]  on HD - [ ]  MWF or [ ]  TTHS, [ ]  Burning with urination, [ ]  Difficulty urinating Skin: [ ]  Rashes, [ ]  Wounds Psychological: [ ]  Anxiety, [ ]  Depression   Social History History  Substance Use Topics  . Smoking status: Former Smoker    Quit date: 10/19/1993  . Smokeless tobacco: Never Used  . Alcohol Use: No    Family History Family History  Problem Relation Age of Onset  . Anesthesia problems Neg  Hx   . Hypotension Neg Hx   . Malignant hyperthermia Neg Hx   . Pseudochol deficiency Neg Hx     Allergies  Allergen Reactions  . Vicodin (Hydrocodone-Acetaminophen) Other (See Comments)    'too strong' causes me to 'fall out'    Current Outpatient Prescriptions  Medication Sig Dispense Refill  . Ascorbic Acid (VITAMIN C) 100 MG tablet Take 100 mg by mouth daily.        Marland Kitchen aspirin EC 81 MG tablet Take 81 mg by mouth daily. Last dose 11/29 prior to procedure.      Marland Kitchen atorvastatin (LIPITOR) 80 MG tablet Take 80 mg by mouth daily.      . Cholecalciferol (VITAMIN D PO) Take 1 tablet by mouth daily. Unsure of strength      . dimenhyDRINATE (DRAMAMINE) 50 MG tablet Take 50 mg by mouth continuous as needed.      Marland Kitchen OVER THE COUNTER MEDICATION Place 1-2 sprays into the nose 2 (two) times daily as needed. Vicks Nasal Spray OTC. As needed for nasal congestion.       Marland Kitchen OVER THE COUNTER MEDICATION Place 1-2 drops into both eyes every morning. Thera Tears opthalmic solution OTC.       . ramipril (ALTACE) 2.5 MG capsule Take  2.5 mg by mouth daily.      . rosuvastatin (CRESTOR) 40 MG tablet Take 40 mg by mouth daily at 6 PM.        Physical Examination  Filed Vitals:   11/05/12 0947  BP: 158/83  Pulse: 87  Resp:     Body mass index is 18.69 kg/(m^2).  General:  WDWN in NAD Gait: Normal HEENT: WNL Eyes: Pupils equal Pulmonary: normal non-labored breathing , without Rales, rhonchi,  wheezing Cardiac: RRR, without  Murmurs, rubs or gallops; Abdomen: soft, NT, no masses Skin: no rashes, ulcers noted  Vascular Exam Pulses: 3+ radial pulses bilaterally Carotid bruits: Carotid pulses to auscultation no bruits are heard Extremities without ischemic changes, no Gangrene , no cellulitis; no open wounds;  Musculoskeletal: no muscle wasting or atrophy   Neurologic: A&O X 3; Appropriate Affect ; SENSATION: normal; MOTOR FUNCTION:  moving all extremities equally. Speech is  fluent/normal  Non-Invasive Vascular Imaging CAROTID DUPLEX 11/05/2012  Right ICA 20 - 39 % stenosis Left ICA 0 - 19% stenosis   ASSESSMENT/PLAN: Asymptomatic patient with no significant change and duplex from June of 2013. The patient will followup in one year with repeat carotid duplex. The patient's questions were encouraged and answered, he is in agreement with this plan.  Lauree Chandler ANP   Clinic MD: Imogene Burn

## 2012-12-07 ENCOUNTER — Other Ambulatory Visit: Payer: Self-pay | Admitting: *Deleted

## 2012-12-07 DIAGNOSIS — I6529 Occlusion and stenosis of unspecified carotid artery: Secondary | ICD-10-CM

## 2012-12-18 DEATH — deceased

## 2013-09-30 ENCOUNTER — Other Ambulatory Visit: Payer: Self-pay | Admitting: Neurosurgery

## 2013-09-30 DIAGNOSIS — I6529 Occlusion and stenosis of unspecified carotid artery: Secondary | ICD-10-CM

## 2013-09-30 DIAGNOSIS — Z48812 Encounter for surgical aftercare following surgery on the circulatory system: Secondary | ICD-10-CM

## 2013-10-17 HISTORY — PX: EYE SURGERY: SHX253

## 2013-10-28 MED ORDER — TETRACAINE HCL 0.5 % OP SOLN
OPHTHALMIC | Status: AC
  Filled 2013-10-28: qty 2

## 2013-10-28 MED ORDER — CYCLOPENTOLATE-PHENYLEPHRINE OP SOLN OPTIME - NO CHARGE
OPHTHALMIC | Status: AC
  Filled 2013-10-28: qty 2

## 2013-10-28 MED ORDER — APRACLONIDINE HCL 1 % OP SOLN
OPHTHALMIC | Status: AC
  Filled 2013-10-28: qty 0.1

## 2013-10-31 ENCOUNTER — Ambulatory Visit (HOSPITAL_COMMUNITY)
Admission: RE | Admit: 2013-10-31 | Discharge: 2013-10-31 | Disposition: A | Discharge status: Home / Self Care | Payer: Medicare Other | Source: Ambulatory Visit | Attending: Ophthalmology | Admitting: Ophthalmology

## 2013-10-31 ENCOUNTER — Encounter (HOSPITAL_COMMUNITY): Payer: Self-pay

## 2013-10-31 ENCOUNTER — Encounter (HOSPITAL_COMMUNITY)
Admission: RE | Disposition: A | Discharge status: Home / Self Care | Payer: Self-pay | Source: Ambulatory Visit | Attending: Ophthalmology

## 2013-10-31 DIAGNOSIS — H26499 Other secondary cataract, unspecified eye: Secondary | ICD-10-CM | POA: Insufficient documentation

## 2013-10-31 HISTORY — PX: YAG LASER APPLICATION: SHX6189

## 2013-10-31 SURGERY — TREATMENT, USING YAG LASER
Anesthesia: LOCAL | Laterality: Right

## 2013-10-31 MED ORDER — APRACLONIDINE HCL 1 % OP SOLN
1.0000 [drp] | OPHTHALMIC | Status: AC
Start: 2013-10-31 — End: 2013-10-31
  Administered 2013-10-31 (×3): 1 [drp] via OPHTHALMIC

## 2013-10-31 MED ORDER — CYCLOPENTOLATE-PHENYLEPHRINE 0.2-1 % OP SOLN
1.0000 [drp] | OPHTHALMIC | Status: AC
  Administered 2013-10-31 (×3): 1 [drp] via OPHTHALMIC

## 2013-10-31 MED ORDER — TETRACAINE HCL 0.5 % OP SOLN
1.0000 [drp] | OPHTHALMIC | Status: AC
  Administered 2013-10-31 (×2): 1 [drp] via OPHTHALMIC

## 2013-10-31 NOTE — H&P (Signed)
I have reviewed the pre printed H&P, the patient was re-examined, and I have identified no significant interval changes in the patient's medical condition.  There is no change in the plan of care since the history and physical of record. 

## 2013-10-31 NOTE — Brief Op Note (Signed)
GOVIND FUREY 10/31/2013  Susa Simmonds, MD  Yag Laser Self Test Completedyes. Procedure: Posterior Capsulotomy, right eye.  Eye Protection Worn by Staff yes. Laser In Use Sign on Door yes.  Laser: Nd:YAG Spot Size: Fixed Burst Mode: III Power Setting: 1.7 mJ/burst Position treated: [psterior capsule Number of shots: 18 Total energy delivered: 30.5 mJ The patient tolerated the procedure without difficulty. No complications were encountered.   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

## 2013-11-02 ENCOUNTER — Encounter (HOSPITAL_COMMUNITY): Payer: Self-pay | Admitting: Ophthalmology

## 2013-11-09 ENCOUNTER — Encounter: Payer: Self-pay | Admitting: Vascular Surgery

## 2013-11-09 ENCOUNTER — Encounter: Payer: Self-pay | Admitting: Family

## 2013-11-11 ENCOUNTER — Ambulatory Visit (INDEPENDENT_AMBULATORY_CARE_PROVIDER_SITE_OTHER): Payer: Medicare Other | Admitting: Family

## 2013-11-11 ENCOUNTER — Other Ambulatory Visit (HOSPITAL_COMMUNITY): Payer: Medicare PPO

## 2013-11-11 ENCOUNTER — Encounter: Payer: Self-pay | Admitting: Family

## 2013-11-11 ENCOUNTER — Ambulatory Visit (HOSPITAL_COMMUNITY)
Admission: RE | Admit: 2013-11-11 | Discharge: 2013-11-11 | Disposition: A | Discharge status: Home / Self Care | Payer: Medicare Other | Source: Ambulatory Visit | Attending: Family | Admitting: Family

## 2013-11-11 DIAGNOSIS — I6529 Occlusion and stenosis of unspecified carotid artery: Secondary | ICD-10-CM

## 2013-11-11 DIAGNOSIS — Z48812 Encounter for surgical aftercare following surgery on the circulatory system: Secondary | ICD-10-CM | POA: Insufficient documentation

## 2013-11-11 DIAGNOSIS — I7789 Other specified disorders of arteries and arterioles: Secondary | ICD-10-CM | POA: Insufficient documentation

## 2013-11-11 NOTE — Progress Notes (Signed)
Established Carotid Patient  History of Present Illness  Clinton Chambers is a 79 y.o. male patient of Dr. Darrick Penna status post left CEA in December of 2012. He returns for routine surveillance.  Patient has Positive history of TIA symptom just before the left CEA was done, this manifested as unilateral weakness of arm and leg, but he does not recall which side; no further stroke or TIA symptoms since this.  The patient denies amaurosis fugax or monocular blindness.   The patient denies receptive or expressive aphasia.  The patient's previous neurologic deficits are Improved. He denies claudication symptoms, denies non-healing wounds. He reports a problem with becoming off balance, but has not fallen.   Reports New Medical or Surgical History: Lasik surgery to right eye, is scheduled to have left eye done.  Pt Diabetic: No Pt smoker: non-smoker, but is exposed to his son's second hand smoke since he lives in the same house and smokes in the house.  Pt meds include: Statin : Yes ASA: Yes Other anticoagulants/antiplatelets: no   Past Medical History  Diagnosis Date  . Dizziness   . Hypertension   . Stroke     end Nov. 2012  . GERD (gastroesophageal reflux disease)     sometimes /w heartburn  . Arthritis     legs  . Carotid artery occlusion     Social History History  Substance Use Topics  . Smoking status: Former Smoker    Quit date: 10/19/1993  . Smokeless tobacco: Never Used  . Alcohol Use: No    Family History Family History  Problem Relation Age of Onset  . Anesthesia problems Neg Hx   . Hypotension Neg Hx   . Malignant hyperthermia Neg Hx   . Pseudochol deficiency Neg Hx     Surgical History Past Surgical History  Procedure Laterality Date  . Cataract extraction      /w IOL  . Endarterectomy  10/21/2011    Procedure: ENDARTERECTOMY CAROTID;  Surgeon: Sherren Kerns, MD;  Location: [facility] OR;  Service: Vascular;  Laterality: Left;  with patch angioplasty using  Hemashield patch 0.8cm x 15.2cm  . Yag laser application Right 10/31/2013    Procedure: YAG LASER APPLICATION;  Surgeon: Susa Simmonds, MD;  Location: [facility] ORS;  Service: Ophthalmology;  Laterality: Right;  . Carotid endarterectomy Left 10-21-11  . Eye surgery Right Dec. 2014    Laser    Allergies  Allergen Reactions  . Vicodin [Hydrocodone-Acetaminophen] Other (See Comments)    'too strong' causes me to 'fall out'    Current Outpatient Prescriptions  Medication Sig Dispense Refill  . Ascorbic Acid (VITAMIN C) 100 MG tablet Take 100 mg by mouth daily.        Marland Kitchen aspirin EC 81 MG tablet Take 81 mg by mouth daily. Last dose 11/29 prior to procedure.      Marland Kitchen atorvastatin (LIPITOR) 80 MG tablet Take 80 mg by mouth daily.      . Cholecalciferol (VITAMIN D PO) Take 1 tablet by mouth daily. Unsure of strength      . dimenhyDRINATE (DRAMAMINE) 50 MG tablet Take 50 mg by mouth continuous as needed.      Marland Kitchen OVER THE COUNTER MEDICATION Place 1-2 sprays into the nose 2 (two) times daily as needed. Vicks Nasal Spray OTC. As needed for nasal congestion.       Marland Kitchen OVER THE COUNTER MEDICATION Place 1-2 drops into both eyes every morning. Thera Tears opthalmic solution OTC.       Marland Kitchen  ramipril (ALTACE) 2.5 MG capsule Take 2.5 mg by mouth daily.      . rosuvastatin (CRESTOR) 40 MG tablet Take 40 mg by mouth daily at 6 PM.       No current facility-administered medications for this visit.    Review of Systems : See HPI for pertinent positives and negatives.  Physical Examination  Filed Vitals:   11/11/13 1059  BP: 135/74  Pulse: 66  Resp: 16   Filed Weights   11/11/13 1059  Weight: 132 lb (59.875 kg)   Body mass index is 18.42 kg/(m^2).   General: WDWN male in NAD GAIT: normal Eyes: PERRLA Pulmonary:  CTAB, Negative  Rales, Negative rhonchi, & Negative wheezing.  Cardiac: regular Rhythm ,  Negative Murmurs.  VASCULAR EXAM Carotid Bruits Left Right   Negative Negative    Radial pulses are  2+ palpable and equal.                                                                                                                            LE Pulses LEFT RIGHT       POPLITEAL  not palpable   not palpable    Gastrointestinal: soft, nontender, BS WNL, no r/g,  negative masses.  Musculoskeletal: Muscle atrophy/wasting appropriate for age. M/S 3/5 throughout, Extremities without ischemic changes.  Neurologic: A&O X 3; Appropriate Affect ; SENSATION ;normal;  Speech is normal CN 2-12 intact, Pain and light touch intact in extremities, Motor exam as listed above.   Non-Invasive Vascular Imaging CAROTID DUPLEX 11/11/2013   Right ICA: <40% stenosis. Left ICA: patent CEA site with evidence of mild hyperplasia at the bifurcation.  These findings are Unchanged from previous exam.  Assessment: Clinton Chambers is a 79 y.o. male who is status post left CEA in December of 2012, and presents asymptomatic with minimal plaque in right ICA and patent left ICA, which is the CEA site, with mild hyperplasia at the bifurcation. The  ICA stenosis is  Unchanged from previous exam. His only risk factor for progression of atherosclerosis is his chronic daily second hand tobacco smoke exposure from his son who lives in the same house and smokes in the house; this was discussed with the patient and his son who is present.  Plan: Follow-up in 1 year with Carotid Duplex scan.   I discussed in depth with the patient the nature of atherosclerosis, and emphasized the importance of maximal medical management including strict control of blood pressure, blood glucose, and lipid levels, obtaining regular exercise, and cessation of exposure to second hand smoking.  The patient is aware that without maximal medical management the underlying atherosclerotic disease process will progress, limiting the benefit of any interventions. The patient was given information about stroke prevention and what symptoms should  prompt the patient to seek immediate medical care. Thank you for allowing [place] to participate in this patient's care.  Rosalita Chessman Pleas Carneal, RN, MSN, FNP-C [facility] [place]  Office: [phone]  Clinic Physician: Imogene Burn  11/11/2013 11:12 AM

## 2013-11-11 NOTE — Patient Instructions (Addendum)
Stroke Prevention Some medical conditions and behaviors are associated with an increased chance of having a stroke. You may prevent a stroke by making healthy choices and managing medical conditions. Reduce your risk of having a stroke by:  Staying physically active. Get at least 30 minutes of activity on most or all days.  Not smoking. It may also be helpful to avoid exposure to secondhand smoke.  Limiting alcohol use. Moderate alcohol use is considered to be:  No more than 2 drinks per day for men.  No more than 1 drink per day for nonpregnant women.  Eating healthy foods.  Include 5 or more servings of fruits and vegetables a day.  Certain diets may be prescribed to address high blood pressure, high cholesterol, diabetes, or obesity.  Managing your cholesterol levels.  A low-saturated fat, low-trans fat, low-cholesterol, and high-fiber diet may control cholesterol levels.  Take any prescribed medicines to control cholesterol as directed by your caregiver.  Managing your diabetes.  A controlled-carbohydrate, controlled-sugar diet is recommended to manage diabetes.  Take any prescribed medicines to control diabetes as directed by your caregiver.  Controlling your high blood pressure (hypertension).  A low-salt (sodium), low-saturated fat, low-trans fat, and low-cholesterol diet is recommended to manage high blood pressure.  Take any prescribed medicines to control hypertension as directed by your caregiver.  Maintaining a healthy weight.  A reduced-calorie, low-sodium, low-saturated fat, low-trans fat, low-cholesterol diet is recommended to manage weight.  Stopping drug abuse.  Avoiding birth control pills.  Talk to your caregiver about the risks of taking birth control pills if you are over 37 years old, smoke, get migraines, or have ever had a blood clot.  Getting evaluated for sleep disorders (sleep apnea).  Talk to your caregiver about getting a sleep evaluation  if you snore a lot or have excessive sleepiness.  Taking medicines as directed by your caregiver.  For some people, aspirin or blood thinners (anticoagulants) are helpful in reducing the risk of forming abnormal blood clots that can lead to stroke. If you have the irregular heart rhythm of atrial fibrillation, you should be on a blood thinner unless there is a good reason you cannot take them.  Understand all your medicine instructions. SEEK IMMEDIATE MEDICAL CARE IF:   You have sudden weakness or numbness of the face, arm, or leg, especially on one side of the body.  You have sudden confusion.  You have trouble speaking (aphasia) or understanding.  You have sudden trouble seeing in one or both eyes.  You have sudden trouble walking.  You have dizziness.  You have a loss of balance or coordination.  You have a sudden, severe headache with no known cause.  You have new chest pain or an irregular heartbeat. Any of these symptoms may represent a serious problem that is an emergency. Do not wait to see if the symptoms will go away. Get medical help right away. Call your local emergency services (911 in [place] Do not drive yourself to the hospital. Document Released: 12/11/2004 Document Revised: 01/26/2012 Document Reviewed: 05/06/2013 [facility] Patient Information 2014 [place], [place].   Secondhand Smoke Secondhand smoke is the smoke exhaled by smokers and the smoke given off by a burning cigarette, cigar, or pipe. When a cigarette is smoked, about half of the smoke is inhaled and exhaled by the smoker, and the other half floats around in the air. Exposure to secondhand smoke is also called involuntary smoking or passive smoking. People can be exposed to secondhand smoke  in:   Homes.  Cars.  Workplaces.  Public places (bars, restaurants, other recreation sites). Exposure to secondhand smoke is hazardous.It contains more than 250 harmful chemicals, including at least 60 that can  cause cancer. These chemicals include:  Arsenic, a heavy metal toxin.  Benzene, a chemical found in gasoline.  Beryllium, a toxic metal.  Cadmium, a metal used in batteries.  Chromium, a metallic element.  Ethylene oxide, a chemical used to sterilize medical devices.  Nickel, a metallic element.  Polonium 210, a chemical element that gives off radiation.  Vinyl chloride, a toxic substance used in the Building control surveyor. Nonsmoking spouses and family members of smokers have higher rates of cancer, heart disease, and serious respiratory illnesses than those not exposed to secondhand smoke.  Nicotine, a nicotine by-product called cotinine, carbon monoxide, and other evidence of secondhand smoke exposure have been found in the body fluids of nonsmokers exposed to secondhand smoke.  Living with a smoker may increase a nonsmoker's chances of developing lung cancer by 20 to 30 percent.  Secondhand smoke may increase the risk of breast cancer, nasal sinus cavity cancer, cervical cancer, bladder cancer, and nose and throat (nasopharyngeal) cancer in adults.  Secondhand smoke may increase the risk of heart disease by 25 to 30 percent. Children are especially at risk from secondhand smoke exposure. Children of smokers have higher rates of:  Pneumonia.  Asthma.  Smoking.  Bronchitis.  Colds.  Chronic cough.  Ear infections.  Tonsilitis.  School absences. Research suggests that exposure to secondhand smoke may cause leukemia, lymphoma, and brain tumors in children. Babies are three times more likely to die from sudden infant death syndrome (SIDS) if their mothers smoked during and after pregnancy. There is no safe level of exposure to secondhand smoke. Studies have shown that even low levels of exposure can be harmful. The only way to fully protect nonsmokers from secondhand smoke exposure is to completely eliminate smoking in indoor spaces. The best thing you can do for your  own health and for your children's health is to stop smoking. You should stop as soon as possible. This is not easy, and you may fail several times at quitting before you get free of this addiction. Nicotine replacement therapy ( such as patches, gum, or lozenges) can help. These therapies can help you deal with the physical symptoms of withdrawal. Attending quit-smoking support groups can help you deal with the emotional issues of quitting smoking.  Even if you are not ready to quit right now, there are some simple changes you can make to reduce the effect of your smoking on your family:  Do not smoke in your home. Smoke away from your home in an open area, preferably outside.  Ask others to not smoke in your home.  Do not smoke while holding a child or when children are near.  Do not smoke in your car.  Avoid restaurants, day care centers, and other places that allow smoking. Document Released: 12/11/2004 Document Revised: 07/28/2012 Document Reviewed: 08/15/2009 [facility] Patient Information 2014 [place], [place].

## 2014-11-23 ENCOUNTER — Ambulatory Visit: Payer: Medicare Other | Admitting: Family

## 2014-11-23 ENCOUNTER — Other Ambulatory Visit (HOSPITAL_COMMUNITY): Payer: Medicare Other

## 2015-09-18 DEATH — deceased

## 2016-05-21 ENCOUNTER — Other Ambulatory Visit (HOSPITAL_COMMUNITY): Payer: Self-pay | Admitting: Pulmonary Disease

## 2016-05-21 ENCOUNTER — Ambulatory Visit (HOSPITAL_COMMUNITY)
Admission: RE | Admit: 2016-05-21 | Discharge: 2016-05-21 | Disposition: A | Discharge status: Home / Self Care | Payer: Medicare Other | Source: Ambulatory Visit | Attending: Pulmonary Disease | Admitting: Pulmonary Disease

## 2016-05-21 DIAGNOSIS — X58XXXA Exposure to other specified factors, initial encounter: Secondary | ICD-10-CM | POA: Diagnosis not present

## 2016-05-21 DIAGNOSIS — S299XXA Unspecified injury of thorax, initial encounter: Secondary | ICD-10-CM

## 2016-05-21 DIAGNOSIS — M5134 Other intervertebral disc degeneration, thoracic region: Secondary | ICD-10-CM | POA: Diagnosis not present

## 2016-05-21 DIAGNOSIS — M5030 Other cervical disc degeneration, unspecified cervical region: Secondary | ICD-10-CM | POA: Insufficient documentation

## 2016-07-08 ENCOUNTER — Emergency Department (HOSPITAL_COMMUNITY)
Admission: EM | Admit: 2016-07-08 | Discharge: 2016-07-08 | Disposition: A | Discharge status: Home / Self Care | Payer: Medicare Other | Attending: Emergency Medicine | Admitting: Emergency Medicine

## 2016-07-08 ENCOUNTER — Emergency Department (HOSPITAL_COMMUNITY): Payer: Medicare Other

## 2016-07-08 ENCOUNTER — Encounter (HOSPITAL_COMMUNITY): Payer: Self-pay

## 2016-07-08 DIAGNOSIS — F0391 Unspecified dementia with behavioral disturbance: Secondary | ICD-10-CM | POA: Diagnosis not present

## 2016-07-08 DIAGNOSIS — R531 Weakness: Secondary | ICD-10-CM | POA: Diagnosis present

## 2016-07-08 DIAGNOSIS — I10 Essential (primary) hypertension: Secondary | ICD-10-CM | POA: Diagnosis not present

## 2016-07-08 DIAGNOSIS — Z8673 Personal history of transient ischemic attack (TIA), and cerebral infarction without residual deficits: Secondary | ICD-10-CM | POA: Insufficient documentation

## 2016-07-08 DIAGNOSIS — Z87891 Personal history of nicotine dependence: Secondary | ICD-10-CM | POA: Insufficient documentation

## 2016-07-08 HISTORY — DX: Unspecified dementia, unspecified severity, without behavioral disturbance, psychotic disturbance, mood disturbance, and anxiety: F03.90

## 2016-07-08 LAB — CBC WITH DIFFERENTIAL/PLATELET
BASOS ABS: 0 10*3/uL (ref 0.0–0.1)
BASOS PCT: 0 %
EOS ABS: 0 10*3/uL (ref 0.0–0.7)
EOS PCT: 0 %
HCT: 39.1 % (ref 39.0–52.0)
Hemoglobin: 13.5 g/dL (ref 13.0–17.0)
Lymphocytes Relative: 20 %
Lymphs Abs: 1.3 10*3/uL (ref 0.7–4.0)
MCH: 30.3 pg (ref 26.0–34.0)
MCHC: 34.5 g/dL (ref 30.0–36.0)
MCV: 87.7 fL (ref 78.0–100.0)
MONO ABS: 0.6 10*3/uL (ref 0.1–1.0)
Monocytes Relative: 10 %
NEUTROS ABS: 4.5 10*3/uL (ref 1.7–7.7)
Neutrophils Relative %: 70 %
Platelets: 176 10*3/uL (ref 150–400)
RBC: 4.46 MIL/uL (ref 4.22–5.81)
RDW: 13.2 % (ref 11.5–15.5)
WBC: 6.5 10*3/uL (ref 4.0–10.5)

## 2016-07-08 LAB — URINALYSIS, ROUTINE W REFLEX MICROSCOPIC
Glucose, UA: NEGATIVE mg/dL
Hgb urine dipstick: NEGATIVE
KETONES UR: 15 mg/dL — AB
LEUKOCYTES UA: NEGATIVE
NITRITE: NEGATIVE
Protein, ur: NEGATIVE mg/dL
Specific Gravity, Urine: 1.025 (ref 1.005–1.030)
pH: 5.5 (ref 5.0–8.0)

## 2016-07-08 LAB — BASIC METABOLIC PANEL
ANION GAP: 7 (ref 5–15)
BUN: 42 mg/dL — ABNORMAL HIGH (ref 6–20)
CALCIUM: 9.2 mg/dL (ref 8.9–10.3)
CO2: 28 mmol/L (ref 22–32)
CREATININE: 1.35 mg/dL — AB (ref 0.61–1.24)
Chloride: 100 mmol/L — ABNORMAL LOW (ref 101–111)
GFR calc Af Amer: 52 mL/min — ABNORMAL LOW (ref >60)
GFR calc non Af Amer: 45 mL/min — ABNORMAL LOW (ref >60)
GLUCOSE: 137 mg/dL — AB (ref 65–99)
Potassium: 3.8 mmol/L (ref 3.5–5.1)
Sodium: 135 mmol/L (ref 135–145)

## 2016-07-08 NOTE — Discharge Instructions (Signed)
You were seen in the ED today with weakness. We found some signs of mild dehydration. You will need to have your creatinine re-checked by your PCP in the next 2-3 days.   I have provided the contact information for Neurology to call as needed for possible focal seizure evaluation.   Return to the ED with any fever, chills, falls, chest pain, or difficulty breathing.

## 2016-07-08 NOTE — ED Provider Notes (Signed)
Emergency Department Provider Note   I have reviewed the triage vital signs and the nursing notes.   HISTORY  Chief Complaint Weakness   HPI Clinton Chambers is a 82 y.o. male with PMH of dementia, prior CVA, HTN, and carotid artery occlusion s/p endarterectomy presents to the emergency department for evaluation of acute on chronic altered mental status with episodes of wet cough and altered speech. The patient's daughter-in-law's at bedside provides most of the history of present illness. The patient is pleasantly confused with dementia and unable to provide significant detail.   The patient's daughter-in-law states that especially with a past week he's had episodes where he seems to stare off and have incomprehensible speech with some random recognizable words/phrases. No tonic-clonic jerking activity. Episodes last approximately 1-2 minutes and then resolved without resulting confusion. The family has not noticed any fever. No medication changes. They do note a somewhat wet cough but states this has been present for a [place] time. They deny recent falls or head trauma. Patient is actively working with his primary care physician to help with home care needs and management of the patient's dementia symptoms.    Past Medical History:  Diagnosis Date  . Arthritis    legs  . Carotid artery occlusion   . Dementia   . Dizziness   . GERD (gastroesophageal reflux disease)    sometimes /w heartburn  . Hypertension   . Stroke [facility])    end Nov. 2012    Patient Active Problem List   Diagnosis Date Noted  . Aftercare following surgery of the circulatory system, NEC 11/11/2013  . Arrhythmia 11/06/2011  . Mixed hyperlipidemia 11/06/2011  . History of CEA (carotid endarterectomy) 11/06/2011  . Occlusion and stenosis of carotid artery without mention of cerebral infarction 11/06/2011  . TIA (transient ischemic attack) 10/05/2011    Past Surgical History:  Procedure Laterality Date  .  CAROTID ENDARTERECTOMY Left 10-21-11  . CATARACT EXTRACTION     /w IOL  . ENDARTERECTOMY  10/21/2011   Procedure: ENDARTERECTOMY CAROTID;  Surgeon: Sherren Kernsharles E Fields, MD;  [facility];  Service: Vascular;  Laterality: Left;  with patch angioplasty using Hemashield patch 0.8cm x 15.2cm  . EYE SURGERY Right Dec. 2014   Laser  . YAG LASER APPLICATION Right 10/31/2013   Procedure: YAG LASER APPLICATION;  Surgeon: Susa Simmondsarroll F Haines, MD;  Location: [facility] ORS;  Service: Ophthalmology;  Laterality: Right;    Current Outpatient Rx  . Order #: [id number] Class: Historical Med  . Order #: [id number] Class: Historical Med    Allergies Vicodin [hydrocodone-acetaminophen]  Family History  Problem Relation Age of Onset  . Anesthesia problems Neg Hx   . Hypotension Neg Hx   . Malignant hyperthermia Neg Hx   . Pseudochol deficiency Neg Hx     Social History Social History  Substance Use Topics  . Smoking status: Former Smoker    Quit date: 10/19/1993  . Smokeless tobacco: Never Used  . Alcohol use No    Review of Systems  Limited by patient AMS and dementia.   ____________________________________________   PHYSICAL EXAM:  VITAL SIGNS: ED Triage Vitals  Enc Vitals Group     BP 07/08/16 1925 125/68     Pulse Rate 07/08/16 1925 69     Resp 07/08/16 1925 18     Temp 07/08/16 1925 97.7 F (36.5 C)     Temp Source 07/08/16 1925 Oral     SpO2 07/08/16 1925 100 %  Weight 07/08/16 1919 115 lb (52.2 kg)     Height 07/08/16 1919 5\' 11"  (1.803 m)    Constitutional: Alert. Pleasantly demented.  Eyes: Conjunctivae are normal. PERRL.  Head: Atraumatic. Nose: No congestion/rhinnorhea. Mouth/Throat: Mucous membranes are moist.  Oropharynx non-erythematous. Neck: No stridor. Cardiovascular: Normal rate, regular rhythm. Good peripheral circulation. Grossly normal heart sounds.   Respiratory: Normal respiratory effort.  No retractions. Lungs CTAB. Gastrointestinal: Soft and nontender. No  distention.  Musculoskeletal: No lower extremity tenderness nor edema. No gross deformities of extremities. Neurologic:  Normal speech and language. No gross focal neurologic deficits are appreciated.  Skin:  Skin is warm, dry and intact. No rash noted. Psychiatric: Mood and affect are normal. Speech and behavior are normal with some confusion as noted above.   ____________________________________________   LABS (all labs ordered are listed, but only abnormal results are displayed)  Labs Reviewed  BASIC METABOLIC PANEL - Abnormal; Notable for the following:       Result Value   Chloride 100 (*)    Glucose, Bld 137 (*)    BUN 42 (*)    Creatinine, Ser 1.35 (*)    GFR calc non Af Amer 45 (*)    GFR calc Af Amer 52 (*)    All other components within normal limits  URINALYSIS, ROUTINE W REFLEX MICROSCOPIC (NOT AT [facility]) - Abnormal; Notable for the following:    Bilirubin Urine SMALL (*)    Ketones, ur 15 (*)    All other components within normal limits  CBC WITH DIFFERENTIAL/PLATELET   ____________________________________________  EKG   EKG Interpretation  Date/Time:  Tuesday July 08 2016 19:20:04 EDT Ventricular Rate:  74 PR Interval:    QRS Duration: 85 QT Interval:  432 QTC Calculation: 480 R Axis:   58 Text Interpretation:  Sinus rhythm Atrial premature complex Borderline prolonged PR interval Probable anteroseptal infarct, old Since last tracing rate slower Confirmed by [facility]  MD, ELLIOTT [phone]) on 07/08/2016 8:04:41 PM       ____________________________________________  RADIOLOGY  Dg Chest 2 View  Result Date: 07/08/2016 CLINICAL DATA:  Worsening mental status and weight loss. EXAM: CHEST  2 VIEW COMPARISON:  Chest radiograph 01/01/2012 FINDINGS: The lungs are hyperinflated. 6 mm calcified nodule in the right upper lobe is unchanged compared to 2013. There is aortic atherosclerosis. IMPRESSION: 1. Emphysema. 2. No focal airspace disease. Electronically Signed    By: Deatra Robinson M.D.   On: 07/08/2016 21:05   Ct Head Wo Contrast  Result Date: 07/08/2016 CLINICAL DATA:  Generalized weakness and weight loss. History of dementia. EXAM: CT HEAD WITHOUT CONTRAST TECHNIQUE: Contiguous axial images were obtained from the base of the skull through the vertex without intravenous contrast. COMPARISON:  Brain MRI 10/06/2011 FINDINGS: Brain: There is no mass lesion, intraparenchymal hemorrhage or extra-axial collection. No evidence of acute cortical infarct. No hydrocephalus. There are multiple chronic lacunar infarcts within both cerebellar hemispheres and the left basal ganglia. There is confluent hypoattenuation compatible with advanced chronic microvascular disease. Vascular: Atherosclerotic calcification within the the vertebral and internal carotid arteries. Skull: Unremarkable Sinuses/Orbits: Partial opacification of the right maxillary sinus with high density secretions. The other paranasal sinuses are clear. Bilateral lens replacements. Other: Visualized extracranial soft tissues are normal. IMPRESSION: 1. No acute intracranial abnormality. 2. Findings of advanced chronic microvascular disease and multiple old lacunar infarcts. Electronically Signed   By: Deatra Robinson M.D.   On: 07/08/2016 20:57    ____________________________________________   PROCEDURES  Procedure(s) performed:  Procedures  None ____________________________________________   INITIAL IMPRESSION / ASSESSMENT AND PLAN / ED COURSE  Pertinent labs & imaging results that were available during my care of the patient were reviewed by me and considered in my medical decision making (see chart for details).  Patient resents to the emergency department for evaluation of worsening dementia, wet cough, and transient episodes of speech disturbance. The family has video of one of these episodes. They resolve spontaneously with no resulting confusion. No obvious tonic-clonic seizure activity. Plan  for infectious workup and CT scan of the head for possible underlying subacute bleed from unrecognized trauma. We'll obtain chest x-ray given wet cough. Concerned that this may represent partial seizure. Patient may require further outpatient evaluation and EEG if testing here is negative.   11:13 PM CT head and chest x-ray are negative. No obvious seizure activity while in the ED. No evidence of underlying infection. Plan for PO hydration at home and repeat chemistry via PCP with slight AKI. Will provide outpatient referral to Neurology for further evaluation of partial seizure as needed. Patient will discuss with PCP prior to appointment.   At this time, I do not feel there is any life-threatening condition present. I have reviewed and discussed all results (EKG, imaging, lab, urine as appropriate), exam findings with patient. I have reviewed nursing notes and appropriate previous records.  I feel the patient is safe to be discharged home without further emergent workup. Discussed usual and customary return precautions. Patient and family (if present) verbalize understanding and are comfortable with this plan.  Patient will follow-up with their primary care provider. If they do not have a primary care provider, information for follow-up has been provided to them. All questions have been answered.  ____________________________________________  FINAL CLINICAL IMPRESSION(S) / ED DIAGNOSES  Final diagnoses:  Generalized weakness     MEDICATIONS GIVEN DURING THIS VISIT:  None  NEW OUTPATIENT MEDICATIONS STARTED DURING THIS VISIT:  None   Note:  This document was prepared using Dragon voice recognition software and may include unintentional dictation errors.  Alona BeneJoshua Estefania Kamiya, MD Emergency Medicine    Maia PlanJoshua G Crosby Bevan, MD 07/09/16 [phone]

## 2016-07-08 NOTE — ED Triage Notes (Signed)
Daughter in law states over the past 4 months has been declining with generalized weakness, history of dementia, weight loss, and trouble getting sentences out ( which started over the weekend).

## 2016-07-08 NOTE — ED Notes (Signed)
Patient in gown and EKG done. Given to Dr Effie ShyWentz

## 2016-07-11 ENCOUNTER — Other Ambulatory Visit (HOSPITAL_COMMUNITY)
Admission: RE | Admit: 2016-07-11 | Discharge: 2016-07-11 | Disposition: A | Discharge status: Home / Self Care | Payer: Medicare Other | Source: Other Acute Inpatient Hospital | Attending: Pulmonary Disease | Admitting: Pulmonary Disease

## 2016-07-11 DIAGNOSIS — I10 Essential (primary) hypertension: Secondary | ICD-10-CM | POA: Insufficient documentation

## 2016-07-11 LAB — BASIC METABOLIC PANEL
Anion gap: 9 (ref 5–15)
BUN: 24 mg/dL — ABNORMAL HIGH (ref 6–20)
CHLORIDE: 101 mmol/L (ref 101–111)
CO2: 29 mmol/L (ref 22–32)
CREATININE: 1.34 mg/dL — AB (ref 0.61–1.24)
Calcium: 9.4 mg/dL (ref 8.9–10.3)
GFR calc non Af Amer: 46 mL/min — ABNORMAL LOW (ref >60)
GFR, EST AFRICAN AMERICAN: 53 mL/min — AB (ref >60)
GLUCOSE: 85 mg/dL (ref 65–99)
Potassium: 3.9 mmol/L (ref 3.5–5.1)
Sodium: 139 mmol/L (ref 135–145)

## 2016-07-11 LAB — TSH: TSH: 3.093 u[IU]/mL (ref 0.350–4.500)

## 2016-07-12 LAB — T3, FREE: T3 FREE: 1.8 pg/mL — AB (ref 2.0–4.4)

## 2016-07-13 LAB — T4: T4 TOTAL: 6.7 ug/dL (ref 4.5–12.0)

## 2016-07-14 ENCOUNTER — Other Ambulatory Visit: Payer: Self-pay

## 2016-07-17 ENCOUNTER — Inpatient Hospital Stay (HOSPITAL_COMMUNITY)
Admission: EM | Admit: 2016-07-17 | Discharge: 2016-07-21 | DRG: 313 | Disposition: A | Discharge status: Hospice | Payer: Medicare Other | Attending: Pulmonary Disease | Admitting: Pulmonary Disease

## 2016-07-17 ENCOUNTER — Encounter (HOSPITAL_COMMUNITY): Payer: Self-pay | Admitting: *Deleted

## 2016-07-17 ENCOUNTER — Emergency Department (HOSPITAL_COMMUNITY): Payer: Medicare Other

## 2016-07-17 DIAGNOSIS — R079 Chest pain, unspecified: Secondary | ICD-10-CM | POA: Diagnosis not present

## 2016-07-17 DIAGNOSIS — Z7189 Other specified counseling: Secondary | ICD-10-CM

## 2016-07-17 DIAGNOSIS — Z8673 Personal history of transient ischemic attack (TIA), and cerebral infarction without residual deficits: Secondary | ICD-10-CM

## 2016-07-17 DIAGNOSIS — R627 Adult failure to thrive: Secondary | ICD-10-CM | POA: Diagnosis present

## 2016-07-17 DIAGNOSIS — E860 Dehydration: Secondary | ICD-10-CM | POA: Diagnosis present

## 2016-07-17 DIAGNOSIS — R748 Abnormal levels of other serum enzymes: Secondary | ICD-10-CM | POA: Diagnosis present

## 2016-07-17 DIAGNOSIS — I10 Essential (primary) hypertension: Secondary | ICD-10-CM | POA: Diagnosis present

## 2016-07-17 DIAGNOSIS — Z681 Body mass index (BMI) 19 or less, adult: Secondary | ICD-10-CM

## 2016-07-17 DIAGNOSIS — Z515 Encounter for palliative care: Secondary | ICD-10-CM

## 2016-07-17 DIAGNOSIS — Z9889 Other specified postprocedural states: Secondary | ICD-10-CM

## 2016-07-17 DIAGNOSIS — R1030 Lower abdominal pain, unspecified: Secondary | ICD-10-CM | POA: Diagnosis present

## 2016-07-17 DIAGNOSIS — F03918 Unspecified dementia, unspecified severity, with other behavioral disturbance: Secondary | ICD-10-CM

## 2016-07-17 DIAGNOSIS — Z66 Do not resuscitate: Secondary | ICD-10-CM | POA: Diagnosis present

## 2016-07-17 DIAGNOSIS — F0391 Unspecified dementia with behavioral disturbance: Secondary | ICD-10-CM | POA: Diagnosis present

## 2016-07-17 DIAGNOSIS — R109 Unspecified abdominal pain: Secondary | ICD-10-CM

## 2016-07-17 DIAGNOSIS — K219 Gastro-esophageal reflux disease without esophagitis: Secondary | ICD-10-CM | POA: Diagnosis present

## 2016-07-17 DIAGNOSIS — Z87891 Personal history of nicotine dependence: Secondary | ICD-10-CM

## 2016-07-17 DIAGNOSIS — E43 Unspecified severe protein-calorie malnutrition: Secondary | ICD-10-CM

## 2016-07-17 DIAGNOSIS — R072 Precordial pain: Secondary | ICD-10-CM

## 2016-07-17 LAB — BASIC METABOLIC PANEL
Anion gap: 9 (ref 5–15)
BUN: 26 mg/dL — AB (ref 6–20)
CO2: 28 mmol/L (ref 22–32)
Calcium: 9.8 mg/dL (ref 8.9–10.3)
Chloride: 101 mmol/L (ref 101–111)
Creatinine, Ser: 1.39 mg/dL — ABNORMAL HIGH (ref 0.61–1.24)
GFR, EST AFRICAN AMERICAN: 51 mL/min — AB (ref >60)
GFR, EST NON AFRICAN AMERICAN: 44 mL/min — AB (ref >60)
Glucose, Bld: 117 mg/dL — ABNORMAL HIGH (ref 65–99)
POTASSIUM: 4.4 mmol/L (ref 3.5–5.1)
SODIUM: 138 mmol/L (ref 135–145)

## 2016-07-17 LAB — CBC
HEMATOCRIT: 43 % (ref 39.0–52.0)
Hemoglobin: 14.4 g/dL (ref 13.0–17.0)
MCH: 29.9 pg (ref 26.0–34.0)
MCHC: 33.5 g/dL (ref 30.0–36.0)
MCV: 89.2 fL (ref 78.0–100.0)
PLATELETS: 235 10*3/uL (ref 150–400)
RBC: 4.82 MIL/uL (ref 4.22–5.81)
RDW: 13.2 % (ref 11.5–15.5)
WBC: 9.3 10*3/uL (ref 4.0–10.5)

## 2016-07-17 LAB — URINALYSIS, ROUTINE W REFLEX MICROSCOPIC
Glucose, UA: NEGATIVE mg/dL
LEUKOCYTES UA: NEGATIVE
NITRITE: NEGATIVE
Protein, ur: 30 mg/dL — AB
SPECIFIC GRAVITY, URINE: 1.025 (ref 1.005–1.030)
pH: 6 (ref 5.0–8.0)

## 2016-07-17 LAB — URINE MICROSCOPIC-ADD ON

## 2016-07-17 LAB — LACTIC ACID, PLASMA
LACTIC ACID, VENOUS: 1.5 mmol/L (ref 0.5–1.9)
Lactic Acid, Venous: 1.3 mmol/L (ref 0.5–1.9)

## 2016-07-17 LAB — TROPONIN I
TROPONIN I: 0.03 ng/mL — AB (ref <0.03)
TROPONIN I: 0.04 ng/mL — AB (ref <0.03)
Troponin I: 0.03 ng/mL (ref <0.03)

## 2016-07-17 LAB — HEPATIC FUNCTION PANEL
ALT: 23 U/L (ref 17–63)
AST: 32 U/L (ref 15–41)
Albumin: 3.8 g/dL (ref 3.5–5.0)
Alkaline Phosphatase: 69 U/L (ref 38–126)
BILIRUBIN INDIRECT: 0.7 mg/dL (ref 0.3–0.9)
Bilirubin, Direct: 0.1 mg/dL (ref 0.1–0.5)
TOTAL PROTEIN: 7.6 g/dL (ref 6.5–8.1)
Total Bilirubin: 0.8 mg/dL (ref 0.3–1.2)

## 2016-07-17 LAB — LIPASE, BLOOD: LIPASE: 32 U/L (ref 11–51)

## 2016-07-17 MED ORDER — MEMANTINE HCL 10 MG PO TABS
ORAL_TABLET | ORAL | Status: AC
  Filled 2016-07-17: qty 1

## 2016-07-17 MED ORDER — ACETAMINOPHEN 325 MG PO TABS
650.0000 mg | ORAL_TABLET | ORAL | Status: DC | PRN
  Administered 2016-07-18: 650 mg via ORAL
  Filled 2016-07-17: qty 2

## 2016-07-17 MED ORDER — DONEPEZIL HCL 5 MG PO TABS
10.0000 mg | ORAL_TABLET | Freq: Every day | ORAL | Status: DC
  Administered 2016-07-17 – 2016-07-20 (×4): 10 mg via ORAL
  Filled 2016-07-17 (×3): qty 2

## 2016-07-17 MED ORDER — ONDANSETRON HCL 4 MG/2ML IJ SOLN: 4.0000 mg | Freq: Four times a day (QID) | INTRAMUSCULAR | Status: DC | PRN

## 2016-07-17 MED ORDER — ENSURE ENLIVE PO LIQD
237.0000 mL | Freq: Two times a day (BID) | ORAL | Status: DC
  Administered 2016-07-18 – 2016-07-21 (×7): 237 mL via ORAL

## 2016-07-17 MED ORDER — TRAZODONE HCL 50 MG PO TABS
50.0000 mg | ORAL_TABLET | Freq: Every day | ORAL | Status: DC
  Administered 2016-07-17 – 2016-07-20 (×4): 50 mg via ORAL
  Filled 2016-07-17 (×4): qty 1

## 2016-07-17 MED ORDER — SODIUM CHLORIDE 0.9 % IV SOLN
INTRAVENOUS | Status: DC
  Administered 2016-07-17: 18:00:00 via INTRAVENOUS

## 2016-07-17 MED ORDER — MEMANTINE HCL-DONEPEZIL HCL ER 28-10 MG PO CP24: 1.0000 | ORAL_CAPSULE | Freq: Every day | ORAL | Status: DC

## 2016-07-17 MED ORDER — PANTOPRAZOLE SODIUM 40 MG PO TBEC
40.0000 mg | DELAYED_RELEASE_TABLET | Freq: Every day | ORAL | Status: DC
  Administered 2016-07-18 – 2016-07-21 (×4): 40 mg via ORAL
  Filled 2016-07-17 (×4): qty 1

## 2016-07-17 MED ORDER — MEMANTINE HCL ER 28 MG PO CP24
28.0000 mg | ORAL_CAPSULE | Freq: Every day | ORAL | Status: DC
  Administered 2016-07-17 – 2016-07-20 (×4): 28 mg via ORAL
  Filled 2016-07-17 (×6): qty 1

## 2016-07-17 MED ORDER — ENOXAPARIN SODIUM 30 MG/0.3ML ~~LOC~~ SOLN
30.0000 mg | SUBCUTANEOUS | Status: DC
  Administered 2016-07-17 – 2016-07-20 (×4): 30 mg via SUBCUTANEOUS
  Filled 2016-07-17 (×4): qty 0.3

## 2016-07-17 NOTE — ED Triage Notes (Signed)
Per Pt's son, Pt reported CP and L arm pain to home health nurse this am. Respirations even and unlabored, skin warm, dry, and intact.

## 2016-07-17 NOTE — ED Notes (Signed)
CRITICAL VALUE ALERT  Critical value received:  troponin  Date of notification:  07/17/16  Time of notification:  11532  Critical value read back: yes  Nurse who received alert:  Garald BraverJ. Shayna Eblen rn  MD notified (1st page):  Mcmanus  Time of first page:  1535  MD notified (2nd page):  Time of second page:  Responding MD:  mcmanus  Time MD responded:  1534

## 2016-07-17 NOTE — ED Notes (Signed)
Ultrasound at bedside

## 2016-07-17 NOTE — ED Notes (Signed)
Pt to xray

## 2016-07-17 NOTE — ED Provider Notes (Signed)
[facility]-EMERGENCY DEPT Provider Note   CSN: [id number] Arrival date & time: 07/17/16  1140     History   Chief Complaint Chief Complaint  Patient presents with  . Chest Pain    HPI Clinton Chambers is a 82 y.o. male.  The history is provided by the patient and a relative. The history is limited by the condition of the patient (Hx dementia).  Chest Pain    Pt was seen at 1215. Per pt's family and pt:  While performing physical therapy exercises this morning (walking short distance, sitting in chair moving his legs up and down), pt c/o chest and lower jaw "pain." When pt asked if his chest hurt, he said he was having "stomach pain." When asked where, he says "lower." Pt's family states he did tell them this morning he had stomach pain, but they gave him breakfast "and he ate really good." Pt with significant hx of dementia and unable to further clarify his symptoms. No reported fevers, no vomiting/diarrhea, no back pain, no SOB/cough.    Past Medical History:  Diagnosis Date  . Arthritis    legs  . Carotid artery occlusion   . Dementia   . Dizziness   . GERD (gastroesophageal reflux disease)    sometimes /w heartburn  . Hypertension   . Stroke [facility])    end Nov. 2012    Patient Active Problem List   Diagnosis Date Noted  . Aftercare following surgery of the circulatory system, NEC 11/11/2013  . Arrhythmia 11/06/2011  . Mixed hyperlipidemia 11/06/2011  . History of CEA (carotid endarterectomy) 11/06/2011  . Occlusion and stenosis of carotid artery without mention of cerebral infarction 11/06/2011  . TIA (transient ischemic attack) 10/05/2011    Past Surgical History:  Procedure Laterality Date  . CAROTID ENDARTERECTOMY Left 10-21-11  . CATARACT EXTRACTION     /w IOL  . ENDARTERECTOMY  10/21/2011   Procedure: ENDARTERECTOMY CAROTID;  Surgeon: Sherren Kerns, MD;  Location: [facility] OR;  Service: Vascular;  Laterality: Left;  with patch angioplasty using Hemashield patch 0.8cm x  15.2cm  . EYE SURGERY Right Dec. 2014   Laser  . YAG LASER APPLICATION Right 10/31/2013   Procedure: YAG LASER APPLICATION;  Surgeon: Susa Simmonds, MD;  Location: [facility] ORS;  Service: Ophthalmology;  Laterality: Right;       Home Medications    Prior to Admission medications   Medication Sig Start Date End Date Taking? Authorizing Provider  Memantine HCl-Donepezil HCl (NAMZARIC) 28-10 MG CP24 Take 1 capsule by mouth at bedtime.    Historical Provider, MD  traZODone (DESYREL) 50 MG tablet Take 50 mg by mouth at bedtime. 06/19/16   Historical Provider, MD    Family History Family History  Problem Relation Age of Onset  . Anesthesia problems Neg Hx   . Hypotension Neg Hx   . Malignant hyperthermia Neg Hx   . Pseudochol deficiency Neg Hx     Social History Social History  Substance Use Topics  . Smoking status: Former Smoker    Quit date: 10/19/1993  . Smokeless tobacco: Never Used  . Alcohol use No     Allergies   Vicodin [hydrocodone-acetaminophen]   Review of Systems Review of Systems  Unable to perform ROS: Dementia  Cardiovascular: Positive for chest pain.     Physical Exam Updated Vital Signs BP 127/86   Pulse 85   Temp (!) 94.2 F (34.6 C) (Rectal)   Resp 17   Ht 5\' 11"  (1.803  m)   Wt 107 lb (48.5 kg)   SpO2 100%   BMI 14.92 kg/m   Physical Exam 1220: Physical examination:  Nursing notes reviewed; Vital signs and O2 SAT reviewed;  Constitutional: Well developed, Well nourished, Well hydrated, In no acute distress; Head:  Normocephalic, atraumatic; Eyes: EOMI, PERRL, No scleral icterus; ENMT: Mouth and pharynx normal, Mucous membranes moist; Neck: Supple, Full range of motion, No lymphadenopathy; Cardiovascular: Regular rate and rhythm, No gallop; Respiratory: Breath sounds clear & equal bilaterally, No wheezes.  Speaking full sentences with ease, Normal respiratory effort/excursion; Chest: Nontender, Movement normal; Abdomen: Soft, +minimal suprapubic  tenderness to palp. No rebound or guarding. Nondistended, Normal bowel sounds; Genitourinary: No CVA tenderness; Extremities: Pulses normal, No tenderness, No edema, No calf edema or asymmetry.; Neuro: Awake, alert, confused per hx dementia. Speech clear. Moves all extremities on stretcher spontaneously without gross focal motor deficit..; Skin: Color normal, Warm, Dry.   ED Treatments / Results  Labs (all labs ordered are listed, but only abnormal results are displayed)   EKG  EKG Interpretation  Date/Time:  Thursday July 17 2016 11:50:18 EDT Ventricular Rate:  90 PR Interval:    QRS Duration: 102 QT Interval:  367 QTC Calculation: 449 R Axis:   75 Text Interpretation:  Sinus tachycardia Multiform ventricular premature complexes Right atrial enlargement Anterior infarct, old Nonspecific T abnormalities, lateral leads Baseline wander Artifact Repeat tracings suggested Confirmed by [facility]  MD, Nicholos JohnsKATHLEEN [phone]([id number]) on 07/17/2016 12:51:10 PM        EKG Interpretation  Date/Time:  Thursday July 17 2016 11:50:43 EDT Ventricular Rate:  80 PR Interval:    QRS Duration: 81 QT Interval:  401 QTC Calculation: 463 R Axis:   75 Text Interpretation:  Normal sinus rhythm Premature atrial complexes Borderline prolonged PR interval Anterior infarct, old Nonspecific T abnormalities, lateral leads Baseline wander When compared with ECG of 07/08/2016 No significant change was found Confirmed by [facility]  MD, Nicholos JohnsKATHLEEN [phone]([id number]) on 07/17/2016 12:52:59 PM         Radiology   Procedures Procedures (including critical care time)  Medications Ordered in ED Medications - No data to display   Initial Impression / Assessment and Plan / ED Course  I have reviewed the triage vital signs and the nursing notes.  Pertinent labs & imaging results that were available during my care of the patient were reviewed by me and considered in my medical decision making (see chart for details).  MDM Reviewed:  previous chart, nursing note and vitals Reviewed previous: labs and ECG Interpretation: labs, ECG and x-ray    Results for orders placed or performed during the hospital encounter of 07/17/16  Basic metabolic panel  Result Value Ref Range   Sodium 138 135 - 145 mmol/L   Potassium 4.4 3.5 - 5.1 mmol/L   Chloride 101 101 - 111 mmol/L   CO2 28 22 - 32 mmol/L   Glucose, Bld 117 (H) 65 - 99 mg/dL   BUN 26 (H) 6 - 20 mg/dL   Creatinine, Ser 0.981.39 (H) 0.61 - 1.24 mg/dL   Calcium 9.8 8.9 - 11.910.3 mg/dL   GFR calc non Af Amer 44 (L) >60 mL/min   GFR calc Af Amer 51 (L) >60 mL/min   Anion gap 9 5 - 15  CBC  Result Value Ref Range   WBC 9.3 4.0 - 10.5 K/uL   RBC 4.82 4.22 - 5.81 MIL/uL   Hemoglobin 14.4 13.0 - 17.0 g/dL   HCT 14.743.0 82.939.0 -  52.0 %   MCV 89.2 78.0 - 100.0 fL   MCH 29.9 26.0 - 34.0 pg   MCHC 33.5 30.0 - 36.0 g/dL   RDW 16.1 09.6 - 04.5 %   Platelets 235 150 - 400 K/uL  Troponin I  Result Value Ref Range   Troponin I 0.03 (HH) <0.03 ng/mL  Urinalysis, Routine w reflex microscopic  Result Value Ref Range   Color, Urine AMBER (A) YELLOW   APPearance CLEAR CLEAR   Specific Gravity, Urine 1.025 1.005 - 1.030   pH 6.0 5.0 - 8.0   Glucose, UA NEGATIVE NEGATIVE mg/dL   Hgb urine dipstick MODERATE (A) NEGATIVE   Bilirubin Urine SMALL (A) NEGATIVE   Ketones, ur TRACE (A) NEGATIVE mg/dL   Protein, ur 30 (A) NEGATIVE mg/dL   Nitrite NEGATIVE NEGATIVE   Leukocytes, UA NEGATIVE NEGATIVE  Lipase, blood  Result Value Ref Range   Lipase 32 11 - 51 U/L  Hepatic function panel  Result Value Ref Range   Total Protein 7.6 6.5 - 8.1 g/dL   Albumin 3.8 3.5 - 5.0 g/dL   AST 32 15 - 41 U/L   ALT 23 17 - 63 U/L   Alkaline Phosphatase 69 38 - 126 U/L   Total Bilirubin 0.8 0.3 - 1.2 mg/dL   Bilirubin, Direct 0.1 0.1 - 0.5 mg/dL   Indirect Bilirubin 0.7 0.3 - 0.9 mg/dL  Lactic acid, plasma  Result Value Ref Range   Lactic Acid, Venous 1.5 0.5 - 1.9 mmol/L  Urine microscopic-add on    Result Value Ref Range   Squamous Epithelial / LPF 0-5 (A) NONE SEEN   WBC, UA 0-5 0 - 5 WBC/hpf   RBC / HPF 6-30 0 - 5 RBC/hpf   Bacteria, UA RARE (A) NONE SEEN   Casts HYALINE CASTS (A) NEGATIVE   Dg Chest 2 View Result Date: 07/17/2016 CLINICAL DATA:  Chest pain, left arm pain EXAM: CHEST  2 VIEW COMPARISON:  07/08/2016 FINDINGS: There is biapical scarring. Calcified right upper lobe pulmonary nodule likely reflecting sequela prior granulomatous disease. There is no focal parenchymal opacity. There is no pleural effusion or pneumothorax. The heart and mediastinal contours are unremarkable. The osseous structures are unremarkable. IMPRESSION: No active cardiopulmonary disease. Electronically Signed   By: Elige Ko   On: 07/17/2016 12:47   US Abdomen Complete Result Date: 07/17/2016 CLINICAL DATA:  82 year old male with abdominal pain.  Dementia. EXAM: ABDOMEN ULTRASOUND COMPLETE COMPARISON:  None. FINDINGS: Gallbladder: No gallstones or wall thickening visualized. No sonographic Murphy sign noted by sonographer. Common bile duct: Diameter: 5 mm Liver: No focal lesion identified. Within normal limits in parenchymal echogenicity. IVC: No abnormality visualized. Pancreas: Visualized portion unremarkable. Spleen: Size and appearance within normal limits. Right Kidney: Length: 7.8 cm. Small right kidney with mild parenchymal atrophy. Normal parenchymal echogenicity. No right hydronephrosis. No right renal mass demonstrated. Left Kidney: Length: 9.3 cm. Small left kidney with mild parenchymal atrophy. Normal parenchymal echogenicity. No left hydronephrosis. No left renal mass demonstrated. Abdominal aorta: Atherosclerotic abdominal aorta with ectatic infrarenal abdominal aorta, maximum diameter 2.7 cm. Other findings: None. IMPRESSION: 1. Ectatic atherosclerotic infrarenal abdominal aorta, maximum diameter 2.7 cm. Ectatic abdominal aorta at risk for aneurysm development. Recommend followup by ultrasound  in 5 years. This recommendation follows ACR consensus guidelines: White Paper of the ACR Incidental Findings Committee II on Vascular Findings. J Am Coll Radiol 2013; 10:789-794. 2. Small kidneys with mild parenchymal atrophy.  No hydronephrosis. 3. Otherwise unremarkable abdominal sonogram.  Electronically Signed   By: Delbert Phenix M.D.   On: 07/17/2016 15:00   Dg Abd 2 Views Result Date: 07/17/2016 CLINICAL DATA:  82 year-old male with lower abdominal pain. Initial encounter. EXAM: ABDOMEN - 2 VIEW COMPARISON:  Chest radiographs from today reported separately. FINDINGS: Upright and supine views of the abdomen. No pneumoperitoneum. Negative visible lung bases, pulmonary hyperinflation suspected. Non obstructed bowel gas pattern. All Aortoiliac calcified atherosclerosis noted. Left upper quadrant calcified atherosclerosis. No acute osseous abnormality identified. IMPRESSION: 1.  Normal bowel gas pattern, no free air. 2.  Calcified aortic atherosclerosis. 3. Pulmonary hyperinflation. Electronically Signed   By: Odessa Fleming M.D.   On: 07/17/2016 13:37    1530:  Pt continues to deny CP while in the ED. Troponin mildly elevated, EKG without acute STTW changes. BUN/Cr per baseline. Abd aorta without aneurysm on [place]. Pt's initial temp low, but improved after warming blanket. WBC count, lactic acid normal, CXR and Udip without clear infection; UC is pending.  Dx and testing d/w pt and family.  Questions answered.  Verb understanding, agreeable to admit.  T/C to [facility] Dr. Sharl Ma, case discussed, including:  HPI, pertinent PM/SHx, VS/PE, dx testing, ED course and treatment:  Agreeable to admit, requests to write temporary orders, obtain observation tele bed to Dr. Juanetta Gosling' service.      Final Clinical Impressions(s) / ED Diagnoses   Final diagnoses:  None    New Prescriptions New Prescriptions   No medications on file     Samuel Jester, DO 07/19/16 2012

## 2016-07-17 NOTE — H&P (Signed)
TRH H&P   Patient Demographics:    Clinton Chambers, is a 82 y.o. male  MRN: [id number]  DOB - Mar 08, 1926  Admit Date - 07/17/2016  Outpatient Primary MD for the patient is Fredirick Maudlin, MD  Referring MD/NP/PA: Dr Clarene Duke  Patient coming from: Skilled facility  Chief Complaint  Patient presents with  . Chest Pain      HPI:    Clinton Chambers  is a 82 y.o. male, *History of stroke, hypertension, dementia, carotid artery occlusion, status post carotid endarterectomy who was brought to the ED with chest pain. As per family and patient while performing physical therapy exercises this morning patient complained of chest pain. No he denies chest pain at this time and he complains of abdominal pain. He denies nausea and vomiting. No diarrhea. Patient is a poor historian due to history of dementia.  In the ED, troponin is 0.03. No other significant abnormality    Review of systems:    In addition to the HPI above,  No Fever-chills, No Headache, No changes with Vision or hearing, No problems swallowing food or Liquids, No Abdominal pain, No Nausea or Vomiting, bowel movements are regular, No Blood in stool or Urine, No dysuria, No new skin rashes or bruises, No new joints pains-aches,  No new weakness, tingling, numbness in any extremity, No recent weight gain or loss, No polyuria, polydypsia or polyphagia, No significant Mental Stressors.  A full 10 point Review of Systems was done, except as stated above, all other Review of Systems were negative.   With Past History of the following :    Past Medical History:  Diagnosis Date  . Arthritis    legs  . Carotid artery occlusion   . Dementia   . Dizziness   . GERD (gastroesophageal reflux disease)    sometimes /w heartburn  . Hypertension   . Stroke [facility])    end Nov. 2012      Past Surgical History:  Procedure Laterality Date  .  CAROTID ENDARTERECTOMY Left 10-21-11  . CATARACT EXTRACTION     /w IOL  . ENDARTERECTOMY  10/21/2011   Procedure: ENDARTERECTOMY CAROTID;  Surgeon: Sherren Kerns, MD;  Location: [facility] OR;  Service: Vascular;  Laterality: Left;  with patch angioplasty using Hemashield patch 0.8cm x 15.2cm  . EYE SURGERY Right Dec. 2014   Laser  . YAG LASER APPLICATION Right 10/31/2013   Procedure: YAG LASER APPLICATION;  Surgeon: Susa Simmonds, MD;  Location: [facility] ORS;  Service: Ophthalmology;  Laterality: Right;      Social History:     Social History  Substance Use Topics  . Smoking status: Former Smoker    Quit date: 10/19/1993  . Smokeless tobacco: Never Used  . Alcohol use No        Family History :     Family History  Problem Relation Age of Onset  . Anesthesia problems Neg Hx   . Hypotension Neg Hx   . Malignant hyperthermia Neg Hx   .  Pseudochol deficiency Neg Hx       Home Medications:   Prior to Admission medications   Medication Sig Start Date End Date Taking? Authorizing Provider  Memantine HCl-Donepezil HCl (NAMZARIC) 28-10 MG CP24 Take 1 capsule by mouth at bedtime.   Yes Historical Provider, MD  traZODone (DESYREL) 50 MG tablet Take 50 mg by mouth at bedtime. 06/19/16  Yes Historical Provider, MD     Allergies:     Allergies  Allergen Reactions  . Vicodin [Hydrocodone-Acetaminophen] Other (See Comments)    'too strong' causes me to 'fall out'     Physical Exam:   Vitals  Blood pressure 118/75, pulse 72, temperature 98.2 F (36.8 C), temperature source Rectal, resp. rate 24, height 5\' 11"  (1.803 m), weight 48.5 kg (107 lb), SpO2 99 %.   1. General Elderly- male lying in bed in NAD, cooperative with exam  2. Awake Alert  3. No F.N deficits, ALL C.Nerves Intact, Strength 5/5 all 4 extremities, Sensation intact all 4 extremities, Plantars down going.  4. Ears and Eyes appear Normal, Conjunctivae clear, PERRLA. Moist Oral Mucosa.  5. Supple Neck, No JVD, No  cervical lymphadenopathy appriciated, No Carotid Bruits.  6. Symmetrical Chest wall movement, Good air movement bilaterally, CTAB.  7. RRR, No Gallops, Rubs or Murmurs, No Parasternal Heave.No Leg edema  8. Positive Bowel Sounds, Abdomen Soft, No tenderness, No organomegaly appriciated,No rebound -guarding or rigidity.  9.  No Cyanosis, Normal Skin Turgor, No Skin Rash or Bruise.  10. Good muscle tone,  joints appear normal , no effusions, Normal ROM.      Data Review:    CBC  Recent Labs Lab 07/17/16 1206  WBC 9.3  HGB 14.4  HCT 43.0  PLT 235  MCV 89.2  MCH 29.9  MCHC 33.5  RDW 13.2   ------------------------------------------------------------------------------------------------------------------  Chemistries   Recent Labs Lab 07/11/16 1156 07/17/16 1206 07/17/16 1249  NA 139 138  --   K 3.9 4.4  --   CL 101 101  --   CO2 29 28  --   GLUCOSE 85 117*  --   BUN 24* 26*  --   CREATININE 1.34* 1.39*  --   CALCIUM 9.4 9.8  --   AST  --   --  32  ALT  --   --  23  ALKPHOS  --   --  69  BILITOT  --   --  0.8   ------------------------------------------------------------------------------------------------------------------  ------------------------------------------------------------------------------------------------------------------ GFR: Estimated Creatinine Clearance: 25.2 mL/min (by C-G formula based on SCr of 1.39 mg/dL). Liver Function Tests:  Recent Labs Lab 07/17/16 1249  AST 32  ALT 23  ALKPHOS 69  BILITOT 0.8  PROT 7.6  ALBUMIN 3.8    Recent Labs Lab 07/17/16 1249  LIPASE 32   Cardiac Enzymes:  Recent Labs Lab 07/17/16 1206  TROPONINI 0.03*    --------------------------------------------------------------------------------------------------------------- Urine analysis:    Component Value Date/Time   COLORURINE AMBER (A) 07/17/2016 1309   APPEARANCEUR CLEAR 07/17/2016 1309   LABSPEC 1.025 07/17/2016 1309   PHURINE 6.0  07/17/2016 1309   GLUCOSEU NEGATIVE 07/17/2016 1309   HGBUR MODERATE (A) 07/17/2016 1309   BILIRUBINUR SMALL (A) 07/17/2016 1309   KETONESUR TRACE (A) 07/17/2016 1309   PROTEINUR 30 (A) 07/17/2016 1309   UROBILINOGEN 0.2 01/02/2012 0057   NITRITE NEGATIVE 07/17/2016 1309   LEUKOCYTESUR NEGATIVE 07/17/2016 1309      ----------------------------------------------------------------------------------------------------------------   Imaging Results:    Dg Chest 2 View  Result  Date: 07/17/2016 CLINICAL DATA:  Chest pain, left arm pain EXAM: CHEST  2 VIEW COMPARISON:  07/08/2016 FINDINGS: There is biapical scarring. Calcified right upper lobe pulmonary nodule likely reflecting sequela prior granulomatous disease. There is no focal parenchymal opacity. There is no pleural effusion or pneumothorax. The heart and mediastinal contours are unremarkable. The osseous structures are unremarkable. IMPRESSION: No active cardiopulmonary disease. Electronically Signed   By: Elige Ko   On: 07/17/2016 12:47   US Abdomen Complete  Result Date: 07/17/2016 CLINICAL DATA:  82 year old male with abdominal pain.  Dementia. EXAM: ABDOMEN ULTRASOUND COMPLETE COMPARISON:  None. FINDINGS: Gallbladder: No gallstones or wall thickening visualized. No sonographic Murphy sign noted by sonographer. Common bile duct: Diameter: 5 mm Liver: No focal lesion identified. Within normal limits in parenchymal echogenicity. IVC: No abnormality visualized. Pancreas: Visualized portion unremarkable. Spleen: Size and appearance within normal limits. Right Kidney: Length: 7.8 cm. Small right kidney with mild parenchymal atrophy. Normal parenchymal echogenicity. No right hydronephrosis. No right renal mass demonstrated. Left Kidney: Length: 9.3 cm. Small left kidney with mild parenchymal atrophy. Normal parenchymal echogenicity. No left hydronephrosis. No left renal mass demonstrated. Abdominal aorta: Atherosclerotic abdominal aorta with  ectatic infrarenal abdominal aorta, maximum diameter 2.7 cm. Other findings: None. IMPRESSION: 1. Ectatic atherosclerotic infrarenal abdominal aorta, maximum diameter 2.7 cm. Ectatic abdominal aorta at risk for aneurysm development. Recommend followup by ultrasound in 5 years. This recommendation follows ACR consensus guidelines: White Paper of the ACR Incidental Findings Committee II on Vascular Findings. J Am Coll Radiol 2013; 10:789-794. 2. Small kidneys with mild parenchymal atrophy.  No hydronephrosis. 3. Otherwise unremarkable abdominal sonogram. Electronically Signed   By: Delbert Phenix M.D.   On: 07/17/2016 15:00   Dg Abd 2 Views  Result Date: 07/17/2016 CLINICAL DATA:  82 year-old male with lower abdominal pain. Initial encounter. EXAM: ABDOMEN - 2 VIEW COMPARISON:  Chest radiographs from today reported separately. FINDINGS: Upright and supine views of the abdomen. No pneumoperitoneum. Negative visible lung bases, pulmonary hyperinflation suspected. Non obstructed bowel gas pattern. All Aortoiliac calcified atherosclerosis noted. Left upper quadrant calcified atherosclerosis. No acute osseous abnormality identified. IMPRESSION: 1.  Normal bowel gas pattern, no free air. 2.  Calcified aortic atherosclerosis. 3. Pulmonary hyperinflation. Electronically Signed   By: Odessa Fleming M.D.   On: 07/17/2016 13:37    My personal review of EKG: Rhythm NSR, nonspecific T wave abnormality   Assessment & Plan:    Active Problems:   History of CEA (carotid endarterectomy)   Chest pain   1. Chest pain- chest pain has resolved at this time, EKG shows nonspecific T-wave abnormality. We'll admit the patient to rule out ACS. Cycle cardiac enzymes troponin every 6 hours 3. 2. Abdominal pain- patient has no nausea or vomiting. He is breakfast this morning without any complaints. No nausea or vomiting or diarrhea. Start Protonix 40 mg daily for possible GERD. X-ray abdominal shows normal bowel gas pattern. 3. Mild  dehydration- start gentle IV hydration normal saline at 50 mL per hour.   DVT Prophylaxis-   Lovenox   AM Labs Ordered, also please review Full Orders  Family Communication: Admission, patients condition and plan of care including tests being ordered have been discussed with the patient and his son at bedside who indicate understanding and agree with the plan and Code Status.  Code Status:  DNR  Admission status: Observation  Time spent in minutes : 60 min   LAMA,GAGAN S M.D on 07/17/2016 at 4:30 PM  Between 7am to 7pm - Pager - [phone]. After 7pm go to [email or web address] - password [facility]  [facility] - Office  [phone]

## 2016-07-17 NOTE — ED Notes (Addendum)
According to family pt has hx of dementia and was working on PT exercises today. C/o chin and chest pain. Per pt he denies CP, states "stomach pain". Pt appears in NAD, family at bedside. MD Clarene DukeMcManus notified of rectal temp of 94.2. Nail beds are cyanotic, pt's O2 sat is on earlobe at 100%.

## 2016-07-18 ENCOUNTER — Encounter (HOSPITAL_COMMUNITY): Payer: Self-pay | Admitting: Primary Care

## 2016-07-18 DIAGNOSIS — Z7189 Other specified counseling: Secondary | ICD-10-CM | POA: Diagnosis not present

## 2016-07-18 DIAGNOSIS — I10 Essential (primary) hypertension: Secondary | ICD-10-CM | POA: Diagnosis present

## 2016-07-18 DIAGNOSIS — Z87891 Personal history of nicotine dependence: Secondary | ICD-10-CM | POA: Diagnosis not present

## 2016-07-18 DIAGNOSIS — Z681 Body mass index (BMI) 19 or less, adult: Secondary | ICD-10-CM | POA: Diagnosis not present

## 2016-07-18 DIAGNOSIS — Z515 Encounter for palliative care: Secondary | ICD-10-CM | POA: Diagnosis not present

## 2016-07-18 DIAGNOSIS — Z8673 Personal history of transient ischemic attack (TIA), and cerebral infarction without residual deficits: Secondary | ICD-10-CM | POA: Diagnosis not present

## 2016-07-18 DIAGNOSIS — R748 Abnormal levels of other serum enzymes: Secondary | ICD-10-CM | POA: Diagnosis present

## 2016-07-18 DIAGNOSIS — R079 Chest pain, unspecified: Secondary | ICD-10-CM | POA: Diagnosis present

## 2016-07-18 DIAGNOSIS — E860 Dehydration: Secondary | ICD-10-CM | POA: Diagnosis present

## 2016-07-18 DIAGNOSIS — E43 Unspecified severe protein-calorie malnutrition: Secondary | ICD-10-CM | POA: Diagnosis not present

## 2016-07-18 DIAGNOSIS — K219 Gastro-esophageal reflux disease without esophagitis: Secondary | ICD-10-CM | POA: Diagnosis present

## 2016-07-18 DIAGNOSIS — R627 Adult failure to thrive: Secondary | ICD-10-CM | POA: Diagnosis present

## 2016-07-18 DIAGNOSIS — F03918 Unspecified dementia, unspecified severity, with other behavioral disturbance: Secondary | ICD-10-CM | POA: Diagnosis present

## 2016-07-18 DIAGNOSIS — F0391 Unspecified dementia with behavioral disturbance: Secondary | ICD-10-CM | POA: Diagnosis not present

## 2016-07-18 DIAGNOSIS — R1030 Lower abdominal pain, unspecified: Secondary | ICD-10-CM | POA: Diagnosis present

## 2016-07-18 DIAGNOSIS — Z66 Do not resuscitate: Secondary | ICD-10-CM | POA: Diagnosis present

## 2016-07-18 LAB — BASIC METABOLIC PANEL
ANION GAP: 9 (ref 5–15)
BUN: 30 mg/dL — ABNORMAL HIGH (ref 6–20)
CO2: 27 mmol/L (ref 22–32)
Calcium: 8.8 mg/dL — ABNORMAL LOW (ref 8.9–10.3)
Chloride: 103 mmol/L (ref 101–111)
Creatinine, Ser: 1.32 mg/dL — ABNORMAL HIGH (ref 0.61–1.24)
GFR, EST AFRICAN AMERICAN: 54 mL/min — AB (ref >60)
GFR, EST NON AFRICAN AMERICAN: 46 mL/min — AB (ref >60)
Glucose, Bld: 79 mg/dL (ref 65–99)
POTASSIUM: 3.5 mmol/L (ref 3.5–5.1)
SODIUM: 139 mmol/L (ref 135–145)

## 2016-07-18 LAB — CBC
HCT: 38.6 % — ABNORMAL LOW (ref 39.0–52.0)
Hemoglobin: 12.8 g/dL — ABNORMAL LOW (ref 13.0–17.0)
MCH: 29.4 pg (ref 26.0–34.0)
MCHC: 33.2 g/dL (ref 30.0–36.0)
MCV: 88.7 fL (ref 78.0–100.0)
PLATELETS: 210 10*3/uL (ref 150–400)
RBC: 4.35 MIL/uL (ref 4.22–5.81)
RDW: 13.1 % (ref 11.5–15.5)
WBC: 5.3 10*3/uL (ref 4.0–10.5)

## 2016-07-18 LAB — TROPONIN I: Troponin I: 0.03 ng/mL (ref <0.03)

## 2016-07-18 NOTE — Care Management Obs Status (Signed)
MEDICARE OBSERVATION STATUS NOTIFICATION   Patient Details  Name: Clinton SpillersDonald W Wallis MRN: [id number] Date of Birth: 12/13/1925   Medicare Observation Status Notification Given:  Yes    Shavonne Ambroise, Chrystine OilerSharley Diane, RN 07/18/2016, 1:02 PM

## 2016-07-18 NOTE — Progress Notes (Signed)
Patient pulled IV out. Dr. Juanetta GoslingHawkins notified. New order ok to leave out IV access.

## 2016-07-18 NOTE — Consult Note (Signed)
Consultation Note Date: 07/18/2016   Patient Name: Clinton Chambers  DOB: Oct 08, 1926  MRN: [id number]  Age / Sex: 82 y.o., male  PCP: Clinton Baars, MD Referring Physician: Kari Baars, MD  Reason for Consultation: Disposition, Establishing goals of care, Hospice Evaluation and Psychosocial/spiritual support  HPI/Patient Profile: 82 y.o. male  with past medical history of Stroke, hypertension, dementia, carotid artery occlusion post endarterectomy admitted on 07/17/2016 with nausea and vomiting, abdominal pain.   Clinical Assessment and Goals of Care: Clinton Chambers is resting quietly in bed with his son Clinton Chambers and daughter-in-law Clinton Chambers at bedside. He greets me and makes eye contact as I enter. His pleasantly confused but is able to tell me his name. He is unable to tell me his son's name or where we are.  Clinton Chambers states that Clinton Chambers lives in his own home with his adult disabled son Clinton Chambers as a caretaker. He also shares that they hire a private pay sitter for 4 hours per day.  We talk about Clinton Chambers acute and chronic illnesses. We talk about the dementia trajectory and expected declines. I share a diagram of the chronic illness pathway, and what is normal and expected for people with dementia. Clinton Chambers shares that Clinton Chambers, 5 weeks ago, was able to go out to a restaurant to eat, walking unaided, and able to eat his whole meal, albeit slowly. He shares that his father was able to engage in normal conversation 5 weeks ago.  Clinton Chambers states that their trusted physician, Dr. Juanetta Chambers, has recommended hospice and that they are in agreement and seeking services. We talk about the benefits of hospice, and service providers. Bobby requested we send Clinton Chambers information to hospice of [facility] at this time.  We talk about desired place of death.  Clinton Chambers is unable to his name Clinton Chambers desire place of death. We talk about the  likelihood that it would be difficult for Clinton Chambers if Clinton Chambers were to pass in the home.  We talk about concepts such as let nature take its course, do not treat the next infection (pneumonia is that old persons friend), do not rehospitalize. I share that this is not illegal, or unethical but the medical team realizes that this may be a moral problem for some people. I encourage Clinton Chambers to keep Clinton Chambers in the Chambers of all decision-making, and be guided by whether the test, intervention, or medication will change any outcomes for Clinton Chambers.    HCPOA HCPOA - son Clinton Chambers, Mr. Hove lives with adult disabled son Clinton Chambers.    SUMMARY OF RECOMMENDATIONS   continue to treat the treatable, but no extraordinary measures. Desiring home with hospice.  Code Status/Advance Care Planning:  DNR  Symptom Management:   per Dr. Juanetta Chambers  Palliative Prophylaxis:   Aspiration, Oral Care and Turn Reposition  Additional Recommendations (Limitations, Scope, Preferences):  Continue to treat the treatable, but no extraordinary measures. Desirous of home with hospice.  Psycho-social/Spiritual:   Desire for further Chaplaincy support:no  Additional Recommendations: Caregiving  Support/Resources and Education on Hospice  Prognosis:   < 6 months, likely based on chronic disease burden, market weight loss with weight of 107 pounds, functional decline PPS of 60% 4 months ago, PPS of 50% 5 weeks ago, PPS 30% today. ,s Discharge Planning: Family requesting home with hospice      Primary Diagnoses: Present on Admission: . Chest pain . Dementia with behavioral disturbance . Protein-calorie malnutrition, severe (HCC)   I have reviewed the medical record, interviewed the patient and family, and examined the patient. The following aspects are pertinent.  Past Medical History:  Diagnosis Date  . Arthritis    legs  . Carotid artery occlusion   . Dementia   . Dizziness   . GERD (gastroesophageal reflux disease)     sometimes /w heartburn  . Hypertension   . Stroke Clinton Chambers)    end Nov. 2012   Social History   Social History  . Marital status: Divorced    Spouse name: N/A  . Number of children: N/A  . Years of education: N/A   Social History Main Topics  . Smoking status: Former Smoker    Quit date: 10/19/1993  . Smokeless tobacco: Never Used  . Alcohol use No  . Drug use: No  . Sexual activity: Not Asked   Other Topics Concern  . None   Social History Narrative  . None   Family History  Problem Relation Age of Onset  . Anesthesia problems Neg Hx   . Hypotension Neg Hx   . Malignant hyperthermia Neg Hx   . Pseudochol deficiency Neg Hx    Scheduled Meds: . donepezil  10 mg Oral QHS  . enoxaparin (LOVENOX) injection  30 mg Subcutaneous Q24H  . feeding supplement (ENSURE ENLIVE)  237 mL Oral BID BM  . memantine  28 mg Oral QHS  . pantoprazole  40 mg Oral Q1200  . traZODone  50 mg Oral QHS   Continuous Infusions: . sodium chloride 50 mL/hr at 07/17/16 1812   PRN Meds:.acetaminophen, ondansetron (ZOFRAN) IV Medications Prior to Admission:  Prior to Admission medications   Medication Sig Start Date End Date Taking? Authorizing Provider  Memantine HCl-Donepezil HCl (NAMZARIC) 28-10 MG CP24 Take 1 capsule by mouth at bedtime.   Yes Historical Provider, MD  traZODone (DESYREL) 50 MG tablet Take 50 mg by mouth at bedtime. 06/19/16  Yes Historical Provider, MD   Allergies  Allergen Reactions  . Vicodin [Hydrocodone-Acetaminophen] Other (See Comments)    'too strong' causes me to 'fall out'   Review of Systems  Unable to perform ROS: Dementia    Physical Exam  Constitutional: No distress.  Frail and thin, chronically ill appearing.  HENT:  Head: Normocephalic and atraumatic.  Cardiovascular: Normal rate and regular rhythm.   Pulmonary/Chest: Effort normal. No respiratory distress.  Abdominal: Soft. He exhibits no distension.  Musculoskeletal: He exhibits no edema.    Neurological: He is alert.  Pleasantly confused  Skin: Skin is warm and dry.    Vital Signs: BP (!) 147/81 (BP Location: Right Arm)   Pulse 77   Temp 97.9 F (36.6 C) (Oral)   Resp 16   Ht 5\' 11"  (1.803 m)   Wt 48.5 kg (107 lb)   SpO2 100%   BMI 14.92 kg/m  Pain Assessment: No/denies pain       SpO2: SpO2: 100 % O2 Device:SpO2: 100 % O2 Flow Rate: .   IO: Intake/output summary:  Intake/Output Summary (Last 24 hours) at 07/18/16  1509 Last data filed at 07/18/16 1437  Gross per 24 hour  Intake              920 ml  Output              350 ml  Net              570 ml    LBM: Last BM Date:  (unknown) Baseline Weight: Weight: 48.5 kg (107 lb) Most recent weight: Weight: 48.5 kg (107 lb)     Palliative Assessment/Data:   Flowsheet Rows   Flowsheet Row Most Recent Value  Intake Tab  Referral Department  Pulmonary  Unit at Time of Referral  Med/Surg Unit  Palliative Care Primary Diagnosis  Cardiac  Date Notified  07/18/16  Palliative Care Type  New Palliative care  Reason for referral  Clarify Goals of Care  Date of Admission  07/17/16  Date first seen by Palliative Care  07/18/16  # of days Palliative referral response time  0 Day(s)  # of days IP prior to Palliative referral  1  Clinical Assessment  Palliative Performance Scale Score  20%  Pain Max last 24 hours  Not able to report  Pain Min Last 24 hours  Not able to report  Dyspnea Max Last 24 Hours  Not able to report  Dyspnea Min Last 24 hours  Not able to report  Psychosocial & Spiritual Assessment  Palliative Care Outcomes  Patient/Family meeting held?  Yes  Who was at the meeting?  son Clinton Chambers and DIL [facility]  Palliative Care Outcomes  Counseled regarding hospice, Provided end of life care assistance, Clarified goals of care  Patient/Family wishes: Interventions discontinued/not started   Mechanical Ventilation, PEG  Palliative Care follow-up planned  -- [follow up while at [facility]]      Time In:  1120 Time Out: 1210 Time Total: 50 minutes Greater than 50%  of this time was spent counseling and coordinating care related to the above assessment and plan.  Signed by: Katheran Aweasha A Bryla Burek, NP   Please contact Palliative Medicine Team phone at [phone] for questions and concerns.  For individual provider: See Loretha StaplerAmion

## 2016-07-18 NOTE — Care Management (Signed)
Clinton Chambers, Clinton  Demographics  Comment    Last edited by  on at   Address: Home Phone: Work Civil engineer, contractinghone: Mobile Phone:  [address]  [place] [id number]   [phone] -- --  SSN: Insurance: Marital Status: Religion:  [id number] MEDICARE Divorced Baptist  Patient Ethnicity & Race   Ethnic Group Patient Race  Not Hispanic or Latino White or Caucasian  Documents Filed to Patient   Power of Attorney Living Will Clinical Unknown Study Attachment Consent Form ABN Waiver After Visit Summary Lab Result Scan Code Status MyChart Status Advance Care Planning  Not on File Not on File Not on File Not on File Filed Not on File Filed Filed DNR [Updated on 07/17/16 1703] Pending Jump to the Activity  Admission Information   Attending Provider Admitting Provider Admission Type Admission Date/Time  Kari BaarsEdward Hawkins, MD Kari BaarsEdward Hawkins, MD Emergency 07/17/16 1142  Discharge Date Hospital Service Auth/Cert Status Service Area   Internal Medicine Incomplete [place] SERVICE AREA  Unit Room/Bed Admission Status   [facility]-DEPT 300 A337/A337-01 Admission (Confirmed)   Hospital Account   Name Acct ID Class Status Primary Coverage  [name], [name] [id number] Observation Open MEDICARE - MEDICARE PART A AND B      Guarantor Account (for Hospital Account [id number])   Name Relation to Pt Service Area Active? Acct Type  [name], [name] Self CHSA Yes Personal/Family  Address Phone    [address] [place], [place] [address] [phone])        Coverage Information (for Hospital Account [id number])   1. MEDICARE/MEDICARE PART A AND B   F/O Payor/Plan Precert #  MEDICARE/MEDICARE PART A AND B   Subscriber Subscriber #  [name], [name] [id number]  Address Phone  [address] [place] [phone]   2. GENERIC COMMERCIAL/GENERIC COMMERCIAL   F/O Payor/Plan Precert #  [facility]/GENERIC COMMERCIAL   Subscriber Subscriber #  [name], [name] [id number]  Address Phone   [address] [place], [place] [phone]

## 2016-07-18 NOTE — Care Management (Signed)
If patient discharges over the weekend [facility], [phone].

## 2016-07-18 NOTE — Progress Notes (Signed)
This RN was notified that the patient had a sinus pause of 1.54 seconds. On call MD was notified. Patient is resting comfortably, is alert and denies any discomfort .BP 113/64, HR is 71.

## 2016-07-18 NOTE — Progress Notes (Signed)
Subjective: He has no complaints. He is confused. I found him lying naked in the bed pulling on his Foley catheter equipment.  Objective: Vital signs in last 24 hours: Temp:  [94.2 F (34.6 C)-98.6 F (37 C)] 98 F (36.7 C) (09/01 0451) Pulse Rate:  [58-88] 74 (09/01 0451) Resp:  [17-24] 20 (08/31 2217) BP: (106-148)/(59-87) 120/66 (09/01 0451) SpO2:  [97 %-100 %] 100 % (09/01 0451) Weight:  [48.5 kg (107 lb)] 48.5 kg (107 lb) (08/31 1732) Weight change:  Last BM Date:  (PTA)  Intake/Output from previous day: 08/31 0701 - 09/01 0700 In: 680 [P.O.:240; I.V.:440] Out: -   PHYSICAL EXAM General appearance: alert and Confused Resp: clear to auscultation bilaterally Cardio: regular rate and rhythm, S1, S2 normal, no murmur, click, rub or gallop GI: soft, non-tender; bowel sounds normal; no masses,  no organomegaly Extremities: extremities normal, atraumatic, no cyanosis or edema  Lab Results:  Results for orders placed or performed during the hospital encounter of 07/17/16 (from the past 48 hour(s))  Basic metabolic panel     Status: Abnormal   Collection Time: 07/17/16 12:06 PM  Result Value Ref Range   Sodium 138 135 - 145 mmol/L   Potassium 4.4 3.5 - 5.1 mmol/L   Chloride 101 101 - 111 mmol/L   CO2 28 22 - 32 mmol/L   Glucose, Bld 117 (H) 65 - 99 mg/dL   BUN 26 (H) 6 - 20 mg/dL   Creatinine, Ser 1.39 (H) 0.61 - 1.24 mg/dL   Calcium 9.8 8.9 - 10.3 mg/dL   GFR calc non Af Amer 44 (L) >60 mL/min   GFR calc Af Amer 51 (L) >60 mL/min    Comment: (NOTE) The eGFR has been calculated using the CKD EPI equation. This calculation has not been validated in all clinical situations. eGFR's persistently <60 mL/min signify possible Chronic Kidney Disease.    Anion gap 9 5 - 15  CBC     Status: None   Collection Time: 07/17/16 12:06 PM  Result Value Ref Range   WBC 9.3 4.0 - 10.5 K/uL   RBC 4.82 4.22 - 5.81 MIL/uL   Hemoglobin 14.4 13.0 - 17.0 g/dL   HCT 43.0 39.0 - 52.0 %    MCV 89.2 78.0 - 100.0 fL   MCH 29.9 26.0 - 34.0 pg   MCHC 33.5 30.0 - 36.0 g/dL   RDW 13.2 11.5 - 15.5 %   Platelets 235 150 - 400 K/uL  Troponin I     Status: Abnormal   Collection Time: 07/17/16 12:06 PM  Result Value Ref Range   Troponin I 0.03 (HH) <0.03 ng/mL    Comment: CRITICAL RESULT CALLED TO, READ BACK BY AND VERIFIED WITH: YOUNG,J AT 1328 BY HUFFINES,S ON 07/17/16.   Lipase, blood     Status: None   Collection Time: 07/17/16 12:49 PM  Result Value Ref Range   Lipase 32 11 - 51 U/L  Hepatic function panel     Status: None   Collection Time: 07/17/16 12:49 PM  Result Value Ref Range   Total Protein 7.6 6.5 - 8.1 g/dL   Albumin 3.8 3.5 - 5.0 g/dL   AST 32 15 - 41 U/L   ALT 23 17 - 63 U/L   Alkaline Phosphatase 69 38 - 126 U/L   Total Bilirubin 0.8 0.3 - 1.2 mg/dL   Bilirubin, Direct 0.1 0.1 - 0.5 mg/dL   Indirect Bilirubin 0.7 0.3 - 0.9 mg/dL  Lactic acid, plasma  Status: None   Collection Time: 07/17/16 12:49 PM  Result Value Ref Range   Lactic Acid, Venous 1.5 0.5 - 1.9 mmol/L  Urinalysis, Routine w reflex microscopic     Status: Abnormal   Collection Time: 07/17/16  1:09 PM  Result Value Ref Range   Color, Urine AMBER (A) YELLOW    Comment: BIOCHEMICALS MAY BE AFFECTED BY COLOR   APPearance CLEAR CLEAR   Specific Gravity, Urine 1.025 1.005 - 1.030   pH 6.0 5.0 - 8.0   Glucose, UA NEGATIVE NEGATIVE mg/dL   Hgb urine dipstick MODERATE (A) NEGATIVE   Bilirubin Urine SMALL (A) NEGATIVE   Ketones, ur TRACE (A) NEGATIVE mg/dL   Protein, ur 30 (A) NEGATIVE mg/dL   Nitrite NEGATIVE NEGATIVE   Leukocytes, UA NEGATIVE NEGATIVE  Urine microscopic-add on     Status: Abnormal   Collection Time: 07/17/16  1:09 PM  Result Value Ref Range   Squamous Epithelial / LPF 0-5 (A) NONE SEEN   WBC, UA 0-5 0 - 5 WBC/hpf   RBC / HPF 6-30 0 - 5 RBC/hpf   Bacteria, UA RARE (A) NONE SEEN   Casts HYALINE CASTS (A) NEGATIVE  Lactic acid, plasma     Status: None   Collection  Time: 07/17/16  3:15 PM  Result Value Ref Range   Lactic Acid, Venous 1.3 0.5 - 1.9 mmol/L  Troponin I (q 6hr x 3)     Status: Abnormal   Collection Time: 07/17/16  3:15 PM  Result Value Ref Range   Troponin I 0.03 (HH) <0.03 ng/mL    Comment: CRITICAL VALUE NOTED.  VALUE IS CONSISTENT WITH PREVIOUSLY REPORTED AND CALLED VALUE.  Troponin I (q 6hr x 3)     Status: Abnormal   Collection Time: 07/17/16 11:09 PM  Result Value Ref Range   Troponin I 0.04 (HH) <0.03 ng/mL    Comment: CRITICAL VALUE NOTED.  VALUE IS CONSISTENT WITH PREVIOUSLY REPORTED AND CALLED VALUE.  Troponin I (q 6hr x 3)     Status: Abnormal   Collection Time: 07/18/16  6:40 AM  Result Value Ref Range   Troponin I 0.03 (HH) <0.03 ng/mL    Comment: CRITICAL VALUE NOTED.  VALUE IS CONSISTENT WITH PREVIOUSLY REPORTED AND CALLED VALUE.  CBC     Status: Abnormal   Collection Time: 07/18/16  6:40 AM  Result Value Ref Range   WBC 5.3 4.0 - 10.5 K/uL   RBC 4.35 4.22 - 5.81 MIL/uL   Hemoglobin 12.8 (L) 13.0 - 17.0 g/dL   HCT 38.6 (L) 39.0 - 52.0 %   MCV 88.7 78.0 - 100.0 fL   MCH 29.4 26.0 - 34.0 pg   MCHC 33.2 30.0 - 36.0 g/dL   RDW 13.1 11.5 - 15.5 %   Platelets 210 150 - 400 K/uL  Basic metabolic panel     Status: Abnormal   Collection Time: 07/18/16  6:40 AM  Result Value Ref Range   Sodium 139 135 - 145 mmol/L   Potassium 3.5 3.5 - 5.1 mmol/L    Comment: DELTA CHECK NOTED   Chloride 103 101 - 111 mmol/L   CO2 27 22 - 32 mmol/L   Glucose, Bld 79 65 - 99 mg/dL   BUN 30 (H) 6 - 20 mg/dL   Creatinine, Ser 1.32 (H) 0.61 - 1.24 mg/dL   Calcium 8.8 (L) 8.9 - 10.3 mg/dL   GFR calc non Af Amer 46 (L) >60 mL/min   GFR calc Af Wyvonnia Lora  54 (L) >60 mL/min    Comment: (NOTE) The eGFR has been calculated using the CKD EPI equation. This calculation has not been validated in all clinical situations. eGFR's persistently <60 mL/min signify possible Chronic Kidney Disease.    Anion gap 9 5 - 15    ABGS No results for  input(s): PHART, PO2ART, TCO2, HCO3 in the last 72 hours.  Invalid input(s): PCO2 CULTURES No results found for this or any previous visit (from the past 240 hour(s)). Studies/Results: Dg Chest 2 View  Result Date: 07/17/2016 CLINICAL DATA:  Chest pain, left arm pain EXAM: CHEST  2 VIEW COMPARISON:  07/08/2016 FINDINGS: There is biapical scarring. Calcified right upper lobe pulmonary nodule likely reflecting sequela prior granulomatous disease. There is no focal parenchymal opacity. There is no pleural effusion or pneumothorax. The heart and mediastinal contours are unremarkable. The osseous structures are unremarkable. IMPRESSION: No active cardiopulmonary disease. Electronically Signed   By: Kathreen Devoid   On: 07/17/2016 12:47   US Abdomen Complete  Result Date: 07/17/2016 CLINICAL DATA:  82 year old male with abdominal pain.  Dementia. EXAM: ABDOMEN ULTRASOUND COMPLETE COMPARISON:  None. FINDINGS: Gallbladder: No gallstones or wall thickening visualized. No sonographic Murphy sign noted by sonographer. Common bile duct: Diameter: 5 mm Liver: No focal lesion identified. Within normal limits in parenchymal echogenicity. IVC: No abnormality visualized. Pancreas: Visualized portion unremarkable. Spleen: Size and appearance within normal limits. Right Kidney: Length: 7.8 cm. Small right kidney with mild parenchymal atrophy. Normal parenchymal echogenicity. No right hydronephrosis. No right renal mass demonstrated. Left Kidney: Length: 9.3 cm. Small left kidney with mild parenchymal atrophy. Normal parenchymal echogenicity. No left hydronephrosis. No left renal mass demonstrated. Abdominal aorta: Atherosclerotic abdominal aorta with ectatic infrarenal abdominal aorta, maximum diameter 2.7 cm. Other findings: None. IMPRESSION: 1. Ectatic atherosclerotic infrarenal abdominal aorta, maximum diameter 2.7 cm. Ectatic abdominal aorta at risk for aneurysm development. Recommend followup by ultrasound in 5 years.  This recommendation follows ACR consensus guidelines: White Paper of the ACR Incidental Findings Committee II on Vascular Findings. J Am Coll Radiol 2013; 10:789-794. 2. Small kidneys with mild parenchymal atrophy.  No hydronephrosis. 3. Otherwise unremarkable abdominal sonogram. Electronically Signed   By: Ilona Sorrel M.D.   On: 07/17/2016 15:00   Dg Abd 2 Views  Result Date: 07/17/2016 CLINICAL DATA:  82 year-old male with lower abdominal pain. Initial encounter. EXAM: ABDOMEN - 2 VIEW COMPARISON:  Chest radiographs from today reported separately. FINDINGS: Upright and supine views of the abdomen. No pneumoperitoneum. Negative visible lung bases, pulmonary hyperinflation suspected. Non obstructed bowel gas pattern. All Aortoiliac calcified atherosclerosis noted. Left upper quadrant calcified atherosclerosis. No acute osseous abnormality identified. IMPRESSION: 1.  Normal bowel gas pattern, no free air. 2.  Calcified aortic atherosclerosis. 3. Pulmonary hyperinflation. Electronically Signed   By: Genevie Ann M.D.   On: 07/17/2016 13:37    Medications:  Prior to Admission:  Prescriptions Prior to Admission  Medication Sig Dispense Refill Last Dose  . Memantine HCl-Donepezil HCl (NAMZARIC) 28-10 MG CP24 Take 1 capsule by mouth at bedtime.   07/16/2016 at Unknown time  . traZODone (DESYREL) 50 MG tablet Take 50 mg by mouth at bedtime.   07/16/2016 at Unknown time   Scheduled: . donepezil  10 mg Oral QHS  . enoxaparin (LOVENOX) injection  30 mg Subcutaneous Q24H  . feeding supplement (ENSURE ENLIVE)  237 mL Oral BID BM  . memantine  28 mg Oral QHS  . pantoprazole  40 mg Oral Q1200  .  traZODone  50 mg Oral QHS   Continuous: . sodium chloride 50 mL/hr at 07/17/16 1812   NOM:[id number], ondansetron (ZOFRAN) IV  Assesment:He was admitted with chest pain but that had resolved by the time he came to the hospital. He has severe dementia so it's difficult to get a clear history. He is more confused  this morning. I had long discussion in my office earlier this week with family about his very poor long-term prognosis. I'm going to see about getting palliative care involved. I don't think he needs cardiology consultation because I don't think he is a candidate for any sort of invasive treatment. Active Problems:   History of CEA (carotid endarterectomy)   Chest pain    Plan: Continue treatments asked for palliative evaluation.    LOS: 0 days   , L 07/18/2016, 8:39 AM

## 2016-07-18 NOTE — Care Management Note (Signed)
Case Management Note  Patient Details  Name: Clinton Chambers MRN: [id number] Date of Birth: [birth year]  Subjective/Objective:  Patient is from home with family. [name], the son is at bedside and offering this information. He has a aide 4 days a week for 4 hours a day that family pays for out of pocket. [facility] Dr. Juanetta GoslingHawkins has talked to family about considering hospice, and they are agreeable. Palliative consult has been ordered. [name] has already called and [facility] yesterday. I will reach out to [facility] and send referral information.                   Action/Plan: Anticipate DC home with hospice services. Will await [facility]. Will follow.    Expected Discharge Date:  07/19/16               Expected Discharge Plan:  Home w Hospice Care  In-House Referral:  Hospice / Palliative Care  Discharge planning Services  CM Consult  Post Acute Care Choice:  NA Choice offered to:  NA  DME Arranged:    DME Agency:     HH Arranged:    HH Agency:     Status of Service:  In process, will continue to follow  If discussed at Long Length of Stay Meetings, dates discussed:    Additional Comments:  Vivi Piccirilli, Chrystine OilerSharley Diane, RN 07/18/2016, 12:57 PM

## 2016-07-19 LAB — URINE CULTURE: Culture: NO GROWTH

## 2016-07-19 NOTE — Progress Notes (Signed)
Pt appears to be restless in bed, Continues to pull at mittens and attempts to get out of bed. Denies any pain or discomfort. NT to sit with pt at this time. Will continue to monitor.

## 2016-07-19 NOTE — Progress Notes (Signed)
Review of notes of previous in outpatient care. I had an extensive conversation with son and daughter-in-law concerning SNF, caregiver burnout, hospice, after these discussions I mentioned that social services are also adept at considering possible placement now or in the future this way we will know what possible options are available they seem to agree and we'll continue current therapy until such time Clinton Chambers ZOX:[id number] DOB: 27-Apr-1926 DOA: 07/17/2016 PCP: Fredirick Maudlin, MD   Physical Exam: Blood pressure 138/90, pulse 78, temperature 97.6 F (36.4 C), temperature source Oral, resp. rate 16, height 5\' 11"  (1.803 m), weight 48.5 kg (107 lb), SpO2 100 %. Lungs show scattered rhonchi prolonged expiratory phase no rales no wheezes appreciable heart regular rhythm no S3 or S4 no heaves thrills rubs abdomen soft nontender bowel sounds normoactive   Investigations:  Recent Results (from the past 240 hour(s))  Urine culture     Status: None   Collection Time: 07/17/16  1:09 PM  Result Value Ref Range Status   Specimen Description URINE, CLEAN CATCH  Final   Special Requests NONE  Final   Culture NO GROWTH Performed at [facility]   Final   Report Status 07/19/2016 FINAL  Final     Basic Metabolic Panel:  Recent Labs  81/19/14 1206 07/18/16 0640  NA 138 139  K 4.4 3.5  CL 101 103  CO2 28 27  GLUCOSE 117* 79  BUN 26* 30*  CREATININE 1.39* 1.32*  CALCIUM 9.8 8.8*   Liver Function Tests:  Recent Labs  07/17/16 1249  AST 32  ALT 23  ALKPHOS 69  BILITOT 0.8  PROT 7.6  ALBUMIN 3.8     CBC:  Recent Labs  07/17/16 1206 07/18/16 0640  WBC 9.3 5.3  HGB 14.4 12.8*  HCT 43.0 38.6*  MCV 89.2 88.7  PLT 235 210    US Abdomen Complete  Result Date: 07/17/2016 CLINICAL DATA:  82 year old male with abdominal pain.  Dementia. EXAM: ABDOMEN ULTRASOUND COMPLETE COMPARISON:  None. FINDINGS: Gallbladder: No gallstones or wall thickening visualized. No  sonographic Murphy sign noted by sonographer. Common bile duct: Diameter: 5 mm Liver: No focal lesion identified. Within normal limits in parenchymal echogenicity. IVC: No abnormality visualized. Pancreas: Visualized portion unremarkable. Spleen: Size and appearance within normal limits. Right Kidney: Length: 7.8 cm. Small right kidney with mild parenchymal atrophy. Normal parenchymal echogenicity. No right hydronephrosis. No right renal mass demonstrated. Left Kidney: Length: 9.3 cm. Small left kidney with mild parenchymal atrophy. Normal parenchymal echogenicity. No left hydronephrosis. No left renal mass demonstrated. Abdominal aorta: Atherosclerotic abdominal aorta with ectatic infrarenal abdominal aorta, maximum diameter 2.7 cm. Other findings: None. IMPRESSION: 1. Ectatic atherosclerotic infrarenal abdominal aorta, maximum diameter 2.7 cm. Ectatic abdominal aorta at risk for aneurysm development. Recommend followup by ultrasound in 5 years. This recommendation follows ACR consensus guidelines: White Paper of the ACR Incidental Findings Committee II on Vascular Findings. J Am Coll Radiol 2013; 10:789-794. 2. Small kidneys with mild parenchymal atrophy.  No hydronephrosis. 3. Otherwise unremarkable abdominal sonogram. Electronically Signed   By: Delbert Phenix M.D.   On: 07/17/2016 15:00      Medications:  Impression:  Active Problems:   History of CEA (carotid endarterectomy)   Chest pain   Dementia with behavioral disturbance   Protein-calorie malnutrition, severe (HCC)   Palliative care encounter   Goals of care, counseling/discussion   DNR (do not resuscitate) discussion     Plan: Continue current therapy   Consultants: Palliative care  Procedures   Antibiotics:        Time spent: 45 minutes   LOS: 1 day   Tawania Daponte M   07/19/2016, 2:17 PM

## 2016-07-19 NOTE — Progress Notes (Signed)
Notified Dr. Janna ArchonDiego of A-flutter.  Central tele saved strip to chart.

## 2016-07-19 NOTE — Progress Notes (Addendum)
At 0300 pt was asleep, no longer requiring NT to sit in room. At 0500 pt awake, attempting to get out of bed. Confusion noted, pt reoriented to place and situation.

## 2016-07-20 DIAGNOSIS — R627 Adult failure to thrive: Secondary | ICD-10-CM | POA: Diagnosis present

## 2016-07-20 MED ORDER — PANTOPRAZOLE SODIUM 40 MG PO TBEC: 40.0000 mg | DELAYED_RELEASE_TABLET | Freq: Every day | ORAL | 12 refills | Status: AC

## 2016-07-20 NOTE — Progress Notes (Signed)
Patient with orders to be discharge home with hospice. Equipment not set up at home. Patient unable to be discharge until equipment is set up. Dr. Juanetta GoslingHawkins notified.

## 2016-07-20 NOTE — Progress Notes (Signed)
Subjective: He is much less agitated. He looks back to baseline. He is ready for discharge home with hospice care  Objective: Vital signs in last 24 hours: Temp:  [97.5 F (36.4 C)-98.8 F (37.1 C)] 97.7 F (36.5 C) (09/03 0612) Pulse Rate:  [87-98] 93 (09/03 0612) Resp:  [16] 16 (09/03 0612) BP: (121-186)/(66-84) 128/66 (09/03 0612) SpO2:  [94 %-98 %] 97 % (09/03 0612) Weight change:  Last BM Date: 07/17/16  Intake/Output from previous day: 09/02 0701 - 09/03 0700 In: 440 [P.O.:440] Out: 700 [Urine:700]  PHYSICAL EXAM General appearance: alert, mild distress and Confused Resp: clear to auscultation bilaterally Cardio: regular rate and rhythm, S1, S2 normal, no murmur, click, rub or gallop GI: soft, non-tender; bowel sounds normal; no masses,  no organomegaly Extremities: extremities normal, atraumatic, no cyanosis or edema  Lab Results:  No results found for this or any previous visit (from the past 48 hour(s)).  ABGS No results for input(s): PHART, PO2ART, TCO2, HCO3 in the last 72 hours.  Invalid input(s): PCO2 CULTURES Recent Results (from the past 240 hour(s))  Urine culture     Status: None   Collection Time: 07/17/16  1:09 PM  Result Value Ref Range Status   Specimen Description URINE, CLEAN CATCH  Final   Special Requests NONE  Final   Culture NO GROWTH [facility]   Final   Report Status 07/19/2016 FINAL  Final   Studies/Results: No results found.  Medications:  Prior to Admission:  Prescriptions Prior to Admission  Medication Sig Dispense Refill Last Dose  . Memantine HCl-Donepezil HCl (NAMZARIC) 28-10 MG CP24 Take 1 capsule by mouth at bedtime.   07/16/2016 at Unknown time  . traZODone (DESYREL) 50 MG tablet Take 50 mg by mouth at bedtime.   07/16/2016 at Unknown time   Scheduled: . donepezil  10 mg Oral QHS  . enoxaparin (LOVENOX) injection  30 mg Subcutaneous Q24H  . feeding supplement (ENSURE ENLIVE)  237 mL Oral BID BM  .  memantine  28 mg Oral QHS  . pantoprazole  40 mg Oral Q1200  . traZODone  50 mg Oral QHS   Continuous: . sodium chloride 50 mL/hr at 07/17/16 1812   BJY:[id number]:acetaminophen, ondansetron (ZOFRAN) IV  Assesment:He was admitted with chest pain. He did not have evidence of ischemia. He has multiple other medical problems including dementia severe protein calorie malnutrition and failure to thrive. He is back to baseline. Discussed with his family aren't they agreed to hospice care as appropriate and he will be discharged home with hospice at home Active Problems:   History of CEA (carotid endarterectomy)   Chest pain   Dementia with behavioral disturbance   Protein-calorie malnutrition, severe (HCC)   Palliative care encounter   Goals of care, counseling/discussion   DNR (do not resuscitate) discussion    Plan: As above    LOS: 2 days   Haleigh Desmith L 07/20/2016, 10:05 AM

## 2016-07-20 NOTE — Progress Notes (Addendum)
CM left message on son's voicemail [phone] for return call. CM was not able to leave a message on phone number [phone].  [facility] and aware of patient being discharged sometime this weekend.  Just waiting to be notified from hospice.  Spoke with family and home equipment not received for discharge home.  Staff to contact doctor to make aware.

## 2016-07-20 NOTE — Discharge Summary (Addendum)
Physician Discharge Summary  Patient ID: Clinton Chambers MRN: [id number] DOB/AGE: Jun 23, 1926 82 y.o. Primary Care Physician:Haleem Hanner L, MD Admit date: 07/17/2016 Discharge date: 07/20/2016    Discharge Diagnoses:   Active Problems:   History of CEA (carotid endarterectomy)   Chest pain   Dementia with behavioral disturbance   Protein-calorie malnutrition, severe (HCC)   Palliative care encounter   Goals of care, counseling/discussion   DNR (do not resuscitate) discussion   Adult failure to thrive     Medication List    TAKE these medications   NAMZARIC 28-10 MG Cp24 Generic drug:  Memantine HCl-Donepezil HCl Take 1 capsule by mouth at bedtime.   pantoprazole 40 MG tablet Commonly known as:  PROTONIX Take 1 tablet (40 mg total) by mouth daily at 12 noon.   traZODone 50 MG tablet Commonly known as:  DESYREL Take 50 mg by mouth at bedtime.       Discharged Condition:Improved    Consults: Palliative care  Significant Diagnostic Studies: Dg Chest 2 View  Result Date: 07/17/2016 CLINICAL DATA:  Chest pain, left arm pain EXAM: CHEST  2 VIEW COMPARISON:  07/08/2016 FINDINGS: There is biapical scarring. Calcified right upper lobe pulmonary nodule likely reflecting sequela prior granulomatous disease. There is no focal parenchymal opacity. There is no pleural effusion or pneumothorax. The heart and mediastinal contours are unremarkable. The osseous structures are unremarkable. IMPRESSION: No active cardiopulmonary disease. Electronically Signed   By: Elige Ko   On: 07/17/2016 12:47   Dg Chest 2 View  Result Date: 07/08/2016 CLINICAL DATA:  Worsening mental status and weight loss. EXAM: CHEST  2 VIEW COMPARISON:  Chest radiograph 01/01/2012 FINDINGS: The lungs are hyperinflated. 6 mm calcified nodule in the right upper lobe is unchanged compared to 2013. There is aortic atherosclerosis. IMPRESSION: 1. Emphysema. 2. No focal airspace disease. Electronically Signed    By: Deatra Robinson M.D.   On: 07/08/2016 21:05   Ct Head Wo Contrast  Result Date: 07/08/2016 CLINICAL DATA:  Generalized weakness and weight loss. History of dementia. EXAM: CT HEAD WITHOUT CONTRAST TECHNIQUE: Contiguous axial images were obtained from the base of the skull through the vertex without intravenous contrast. COMPARISON:  Brain MRI 10/06/2011 FINDINGS: Brain: There is no mass lesion, intraparenchymal hemorrhage or extra-axial collection. No evidence of acute cortical infarct. No hydrocephalus. There are multiple chronic lacunar infarcts within both cerebellar hemispheres and the left basal ganglia. There is confluent hypoattenuation compatible with advanced chronic microvascular disease. Vascular: Atherosclerotic calcification within the the vertebral and internal carotid arteries. Skull: Unremarkable Sinuses/Orbits: Partial opacification of the right maxillary sinus with high density secretions. The other paranasal sinuses are clear. Bilateral lens replacements. Other: Visualized extracranial soft tissues are normal. IMPRESSION: 1. No acute intracranial abnormality. 2. Findings of advanced chronic microvascular disease and multiple old lacunar infarcts. Electronically Signed   By: Deatra Robinson M.D.   On: 07/08/2016 20:57   US Abdomen Complete  Result Date: 07/17/2016 CLINICAL DATA:  82 year old male with abdominal pain.  Dementia. EXAM: ABDOMEN ULTRASOUND COMPLETE COMPARISON:  None. FINDINGS: Gallbladder: No gallstones or wall thickening visualized. No sonographic Murphy sign noted by sonographer. Common bile duct: Diameter: 5 mm Liver: No focal lesion identified. Within normal limits in parenchymal echogenicity. IVC: No abnormality visualized. Pancreas: Visualized portion unremarkable. Spleen: Size and appearance within normal limits. Right Kidney: Length: 7.8 cm. Small right kidney with mild parenchymal atrophy. Normal parenchymal echogenicity. No right hydronephrosis. No right renal mass  demonstrated. Left Kidney: Length: 9.3 cm.  Small left kidney with mild parenchymal atrophy. Normal parenchymal echogenicity. No left hydronephrosis. No left renal mass demonstrated. Abdominal aorta: Atherosclerotic abdominal aorta with ectatic infrarenal abdominal aorta, maximum diameter 2.7 cm. Other findings: None. IMPRESSION: 1. Ectatic atherosclerotic infrarenal abdominal aorta, maximum diameter 2.7 cm. Ectatic abdominal aorta at risk for aneurysm development. Recommend followup by ultrasound in 5 years. This recommendation follows ACR consensus guidelines: White Paper of the ACR Incidental Findings Committee II on Vascular Findings. J Am Coll Radiol 2013; 10:789-794. 2. Small kidneys with mild parenchymal atrophy.  No hydronephrosis. 3. Otherwise unremarkable abdominal sonogram. Electronically Signed   By: Delbert Phenix M.D.   On: 07/17/2016 15:00   Dg Abd 2 Views  Result Date: 07/17/2016 CLINICAL DATA:  82 year-old male with lower abdominal pain. Initial encounter. EXAM: ABDOMEN - 2 VIEW COMPARISON:  Chest radiographs from today reported separately. FINDINGS: Upright and supine views of the abdomen. No pneumoperitoneum. Negative visible lung bases, pulmonary hyperinflation suspected. Non obstructed bowel gas pattern. All Aortoiliac calcified atherosclerosis noted. Left upper quadrant calcified atherosclerosis. No acute osseous abnormality identified. IMPRESSION: 1.  Normal bowel gas pattern, no free air. 2.  Calcified aortic atherosclerosis. 3. Pulmonary hyperinflation. Electronically Signed   By: Odessa Fleming M.D.   On: 07/17/2016 13:37    Lab Results: Basic Metabolic Panel:  Recent Labs  16/10/96 1206 07/18/16 0640  NA 138 139  K 4.4 3.5  CL 101 103  CO2 28 27  GLUCOSE 117* 79  BUN 26* 30*  CREATININE 1.39* 1.32*  CALCIUM 9.8 8.8*   Liver Function Tests:  Recent Labs  07/17/16 1249  AST 32  ALT 23  ALKPHOS 69  BILITOT 0.8  PROT 7.6  ALBUMIN 3.8     CBC:  Recent Labs   07/17/16 1206 07/18/16 0640  WBC 9.3 5.3  HGB 14.4 12.8*  HCT 43.0 38.6*  MCV 89.2 88.7  PLT 235 210    Recent Results (from the past 240 hour(s))  Urine culture     Status: None   Collection Time: 07/17/16  1:09 PM  Result Value Ref Range Status   Specimen Description URINE, CLEAN CATCH  Final   Special Requests NONE  Final   Culture NO GROWTH Performed at [facility]   Final   Report Status 07/19/2016 FINAL  Final     Hospital Course: This is an 82 year old who came to the hospital with chest pain. He has been having a steep decline in his status over the last 6 months. He has dementia which has become much worse. He has been losing weight. I had a discussion in my office with his family last week about palliative care/hospice and they were pursuing that. He had elevated troponin but considering his overall situation cardiology consultation was not requested. He has DO NOT RESUSCITATE status. Palliative care consultation was obtained and it was agreed that hospice care was appropriate for him and he is going to be discharged home with hospice care. He had no further chest pain he was very confused the first 24 hours but this improved  Discharge Exam: Blood pressure 128/66, pulse 93, temperature 97.7 F (36.5 C), temperature source Oral, resp. rate 16, height 5\' 11"  (1.803 m), weight 48.5 kg (107 lb), SpO2 97 %. He is very thin. He is confused but not worse.  Disposition: Home with hospice care  Discharge Instructions    Discharge patient    Complete by:  As directed     He was not  able to be discharged on the third because equipment had not been delivered. He spent the night of the third in the hospital with no problems and was discharged home on the fourth   Signed: Christon Gallaway L   07/20/2016, 10:21 AM

## 2016-07-21 NOTE — Progress Notes (Signed)
Pt resting in bed, denies pain or discomfort, slept throughout the night without any disturbances.

## 2016-07-21 NOTE — Progress Notes (Signed)
He was unable to be discharged yesterday because of equipment not being delivered to the home. Apparently equipment is being delivered today and he can be discharged.

## 2016-07-21 NOTE — Progress Notes (Signed)
Clinton Chambers discharged Home with Hospice per MD order.  Discharge instructions reviewed and discussed with the patient, all questions and concerns answered. Copy of instructions given to patient's son.    Medication List    TAKE these medications   NAMZARIC 28-10 MG Cp24 Generic drug:  Memantine HCl-Donepezil HCl Take 1 capsule by mouth at bedtime.   pantoprazole 40 MG tablet Commonly known as:  PROTONIX Take 1 tablet (40 mg total) by mouth daily at 12 noon.   traZODone 50 MG tablet Commonly known as:  DESYREL Take 50 mg by mouth at bedtime.       Patient transported home via EMS,  no distress noted upon discharge.  Rica KoyanagiBonnie M Dennard Vezina 07/21/2016 2:09 PM2

## 2016-07-21 NOTE — Care Management Important Message (Signed)
Important Message  Patient Details  Name: Clinton Chambers MRN: [id number] Date of Birth: 02-05-1926   Medicare Important Message Given:  Yes    Khalifa Knecht, Chrystine OilerSharley Diane, RN 07/21/2016, 10:33 AM

## 2016-08-17 DEATH — deceased

## 2017-01-22 IMAGING — US US ABDOMEN COMPLETE
1 series · 13 of 25 positions shown · non-contrast
Comparison: None.

CLINICAL DATA: 89-year-old male with abdominal pain.  Dementia.

EXAM:
ABDOMEN ULTRASOUND COMPLETE

[Series 1: us abdomen complete · 0.15mm/px · 13 of 136 slices shown]
[im 1/136]
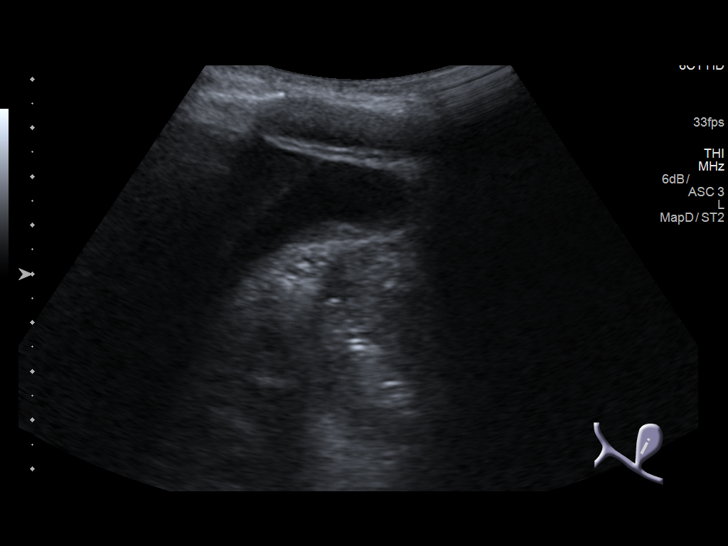
[im 12/136]
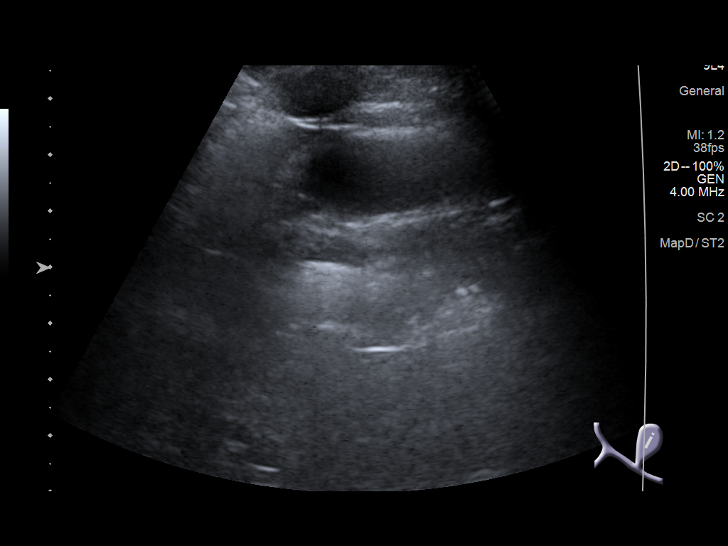
[im 23/136]
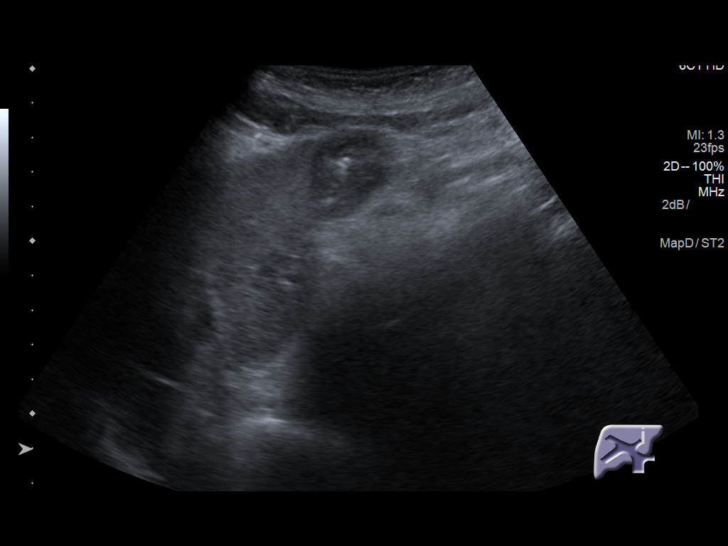
[im 34/136]
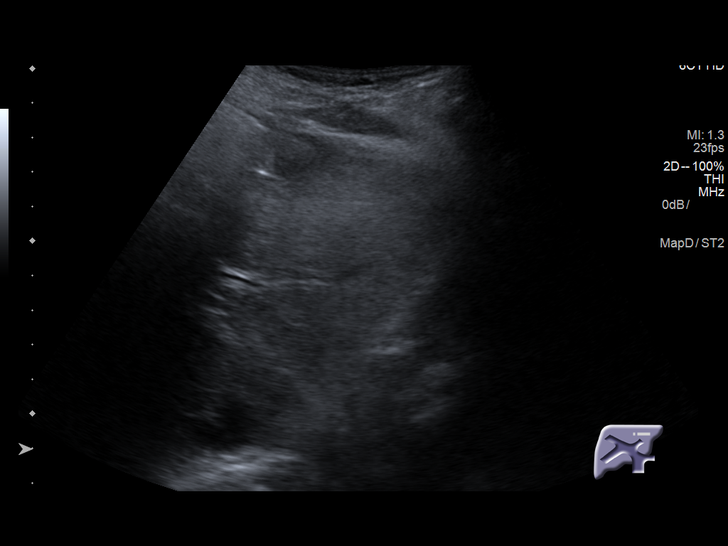
[im 46/136]
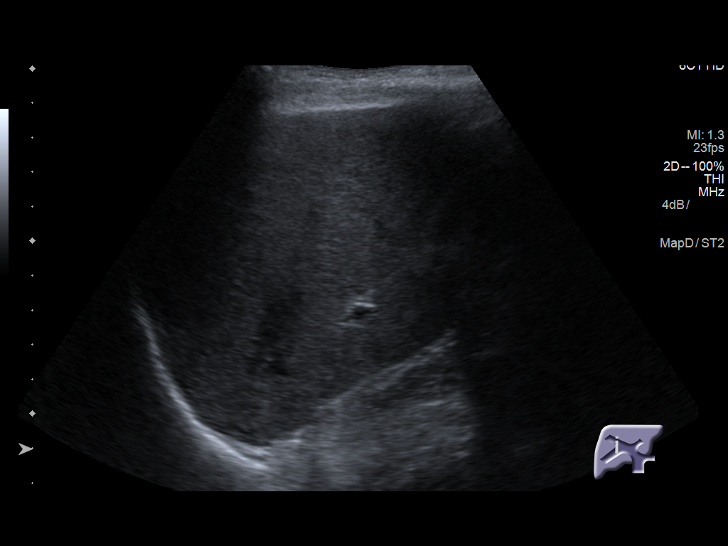
[im 57/136]
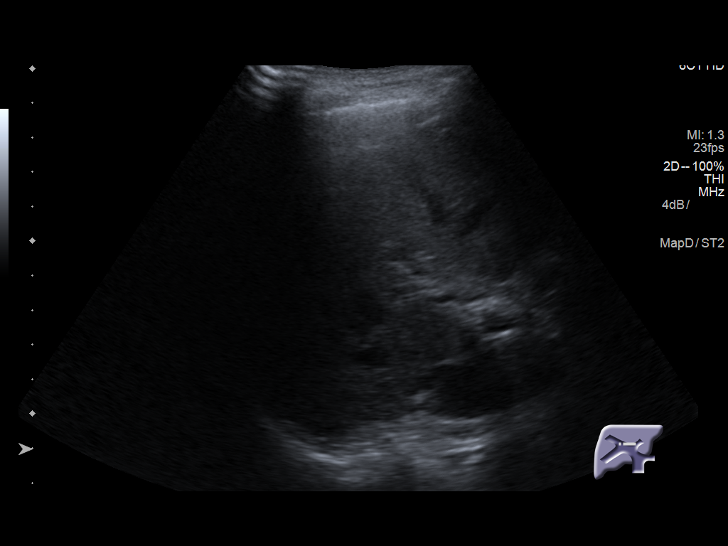
[im 68/136]
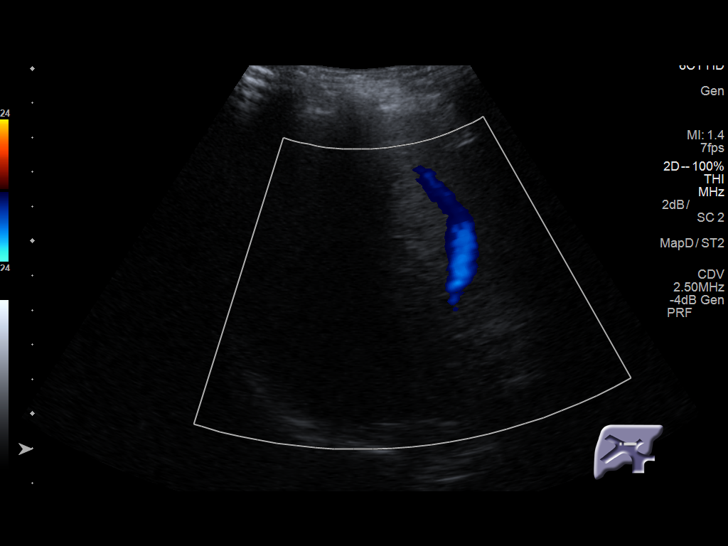
[im 79/136]
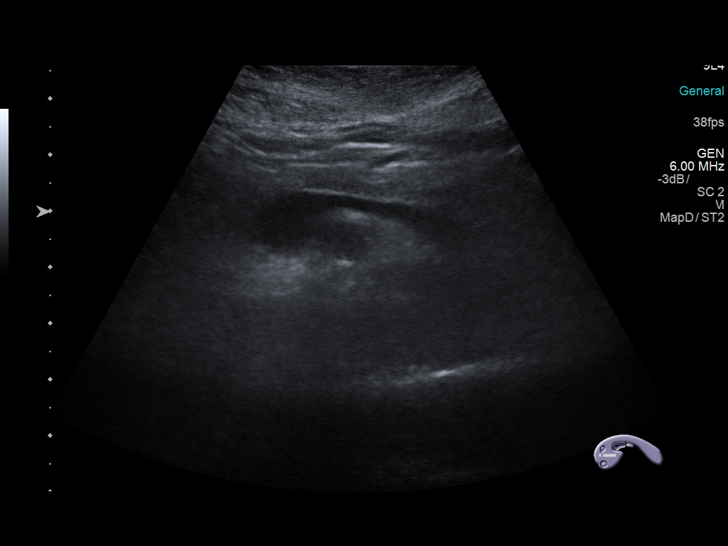
[im 91/136]
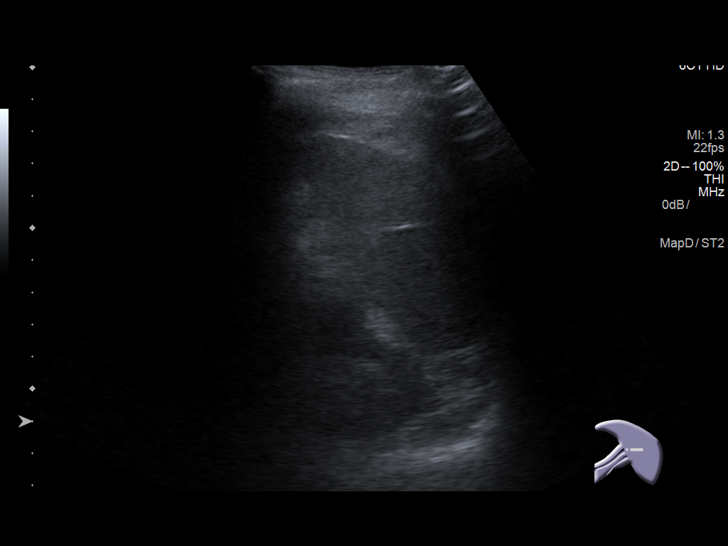
[im 102/136]
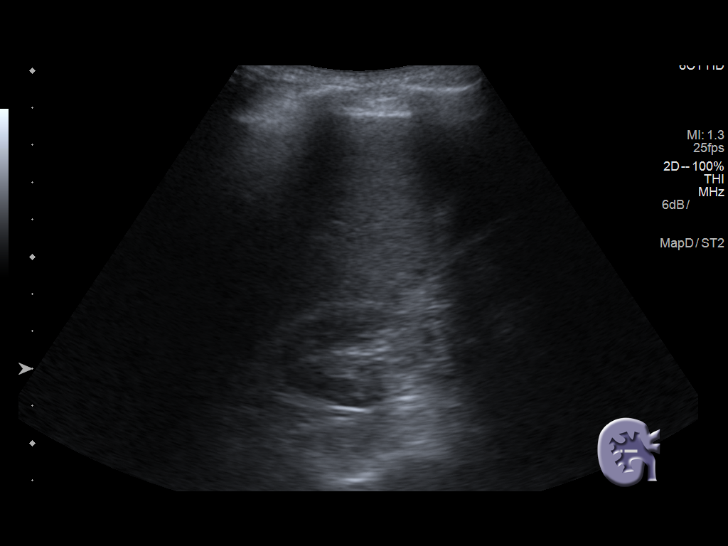
[im 113/136]
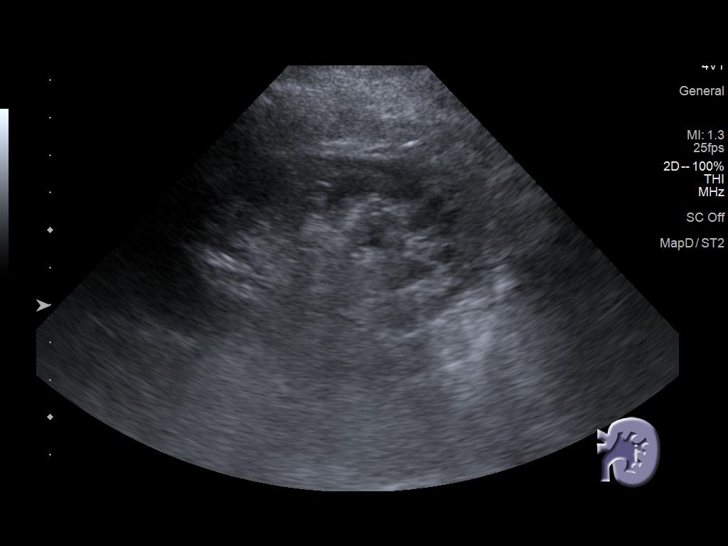
[im 124/136]
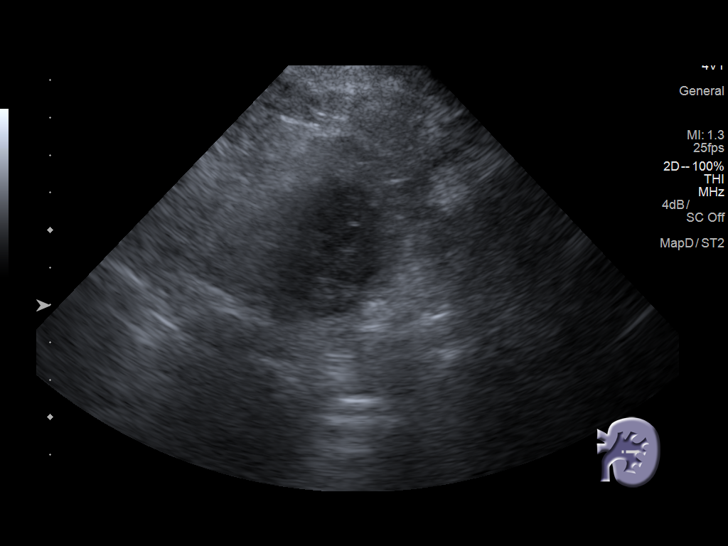
[im 136/136]
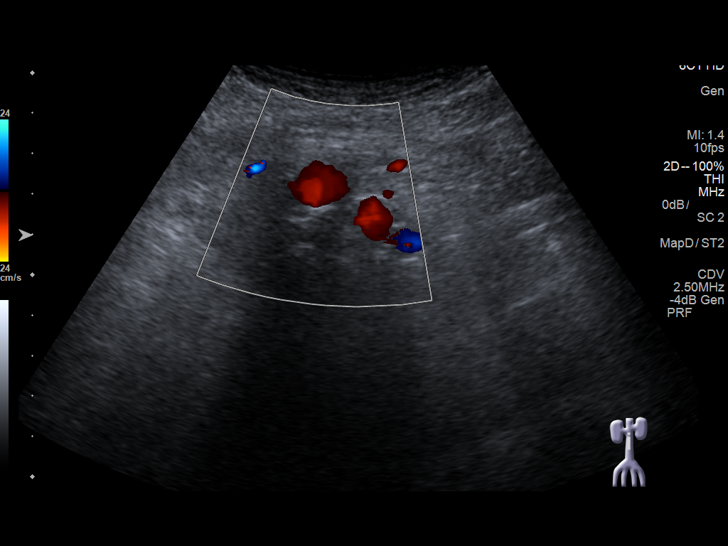

[13 of 25 positions shown; findings below may reference images not displayed]

FINDINGS: Gallbladder: No gallstones or wall thickening visualized. No
sonographic Murphy sign noted by sonographer.

Common bile duct: Diameter: 5 mm

Liver: No focal lesion identified. Within normal limits in
parenchymal echogenicity.

IVC: No abnormality visualized.

Pancreas: Visualized portion unremarkable.

Spleen: Size and appearance within normal limits.

Right Kidney: Length: 7.8 cm. Small right kidney with mild
parenchymal atrophy. Normal parenchymal echogenicity. No right
hydronephrosis. No right renal mass demonstrated.

Left Kidney: Length: 9.3 cm. Small left kidney with mild parenchymal
atrophy. Normal parenchymal echogenicity. No left hydronephrosis. No
left renal mass demonstrated.

Abdominal aorta: Atherosclerotic abdominal aorta with ectatic
infrarenal abdominal aorta, maximum diameter 2.7 cm.

Other findings: None.
IMPRESSION: 1. Ectatic atherosclerotic infrarenal abdominal aorta, maximum
diameter 2.7 cm. Ectatic abdominal aorta at risk for aneurysm
development. Recommend followup by ultrasound in 5 years. This
recommendation follows ACR consensus guidelines: White Paper of the
ACR Incidental Findings Committee II on Vascular Findings. [HOSPITAL] 2488; [DATE].
2. Small kidneys with mild parenchymal atrophy.  No hydronephrosis.
3. Otherwise unremarkable abdominal sonogram.
